# Patient Record
Sex: Male | Born: 1950
Health system: Southern US, Community
[De-identification: ages and names within clinical notes are randomized; demographics above are authoritative.]

## PROBLEM LIST (undated history)

## (undated) DIAGNOSIS — K635 Polyp of colon: Secondary | ICD-10-CM

## (undated) DIAGNOSIS — C4491 Basal cell carcinoma of skin, unspecified: Secondary | ICD-10-CM

## (undated) DIAGNOSIS — L309 Dermatitis, unspecified: Secondary | ICD-10-CM

## (undated) DIAGNOSIS — K219 Gastro-esophageal reflux disease without esophagitis: Secondary | ICD-10-CM

## (undated) DIAGNOSIS — K449 Diaphragmatic hernia without obstruction or gangrene: Secondary | ICD-10-CM

## (undated) HISTORY — DX: Gastro-esophageal reflux disease without esophagitis: K21.9

## (undated) HISTORY — DX: Basal cell carcinoma of skin, unspecified: C44.91

## (undated) HISTORY — DX: Diaphragmatic hernia without obstruction or gangrene: K44.9

## (undated) HISTORY — DX: Polyp of colon: K63.5

## (undated) HISTORY — PX: NASAL SEPTUM SURGERY: SHX37

## (undated) HISTORY — DX: Dermatitis, unspecified: L30.9

---

## 2001-08-20 ENCOUNTER — Other Ambulatory Visit: Admission: RE | Admit: 2001-08-20 | Discharge: 2001-08-20 | Payer: Self-pay | Admitting: Gastroenterology

## 2001-08-20 ENCOUNTER — Encounter (INDEPENDENT_AMBULATORY_CARE_PROVIDER_SITE_OTHER): Payer: Self-pay

## 2001-08-20 ENCOUNTER — Encounter: Payer: Self-pay | Admitting: Gastroenterology

## 2005-05-02 ENCOUNTER — Ambulatory Visit: Payer: Self-pay | Admitting: Gastroenterology

## 2005-05-14 ENCOUNTER — Ambulatory Visit: Payer: Self-pay | Admitting: Gastroenterology

## 2005-05-14 ENCOUNTER — Encounter (INDEPENDENT_AMBULATORY_CARE_PROVIDER_SITE_OTHER): Payer: Self-pay | Admitting: *Deleted

## 2005-05-25 ENCOUNTER — Encounter (INDEPENDENT_AMBULATORY_CARE_PROVIDER_SITE_OTHER): Payer: Self-pay | Admitting: *Deleted

## 2005-05-25 ENCOUNTER — Ambulatory Visit: Payer: Self-pay | Admitting: Gastroenterology

## 2005-06-13 ENCOUNTER — Ambulatory Visit: Payer: Self-pay | Admitting: Gastroenterology

## 2010-04-21 ENCOUNTER — Encounter (INDEPENDENT_AMBULATORY_CARE_PROVIDER_SITE_OTHER): Payer: Self-pay | Admitting: *Deleted

## 2010-05-02 ENCOUNTER — Encounter (INDEPENDENT_AMBULATORY_CARE_PROVIDER_SITE_OTHER): Payer: Self-pay | Admitting: *Deleted

## 2010-05-05 ENCOUNTER — Ambulatory Visit: Payer: Self-pay | Admitting: Gastroenterology

## 2010-06-13 ENCOUNTER — Ambulatory Visit: Payer: Self-pay | Admitting: Gastroenterology

## 2010-08-16 ENCOUNTER — Telehealth: Payer: Self-pay | Admitting: Gastroenterology

## 2010-08-21 ENCOUNTER — Ambulatory Visit: Payer: Self-pay | Admitting: Gastroenterology

## 2010-08-21 DIAGNOSIS — K219 Gastro-esophageal reflux disease without esophagitis: Secondary | ICD-10-CM | POA: Insufficient documentation

## 2010-08-21 DIAGNOSIS — Z860100 Personal history of colon polyps, unspecified: Secondary | ICD-10-CM | POA: Insufficient documentation

## 2010-08-30 ENCOUNTER — Ambulatory Visit: Payer: Self-pay | Admitting: Gastroenterology

## 2010-09-04 ENCOUNTER — Encounter: Payer: Self-pay | Admitting: Gastroenterology

## 2010-12-10 ENCOUNTER — Encounter: Payer: Self-pay | Admitting: Emergency Medicine

## 2010-12-21 NOTE — Letter (Signed)
Summary: Results Letter  [facility]  [address] [place], [place] [address]   Phone: [phone]  Fax: [phone]        September 04, 2010 MRN: [id number]    Zachary Burke [address] [place], [place]  [address]    Dear Zachary Burke,  Your stomach biopsies did not show any remarkable findings (benign gastric glands).  Please continue with the recommendations previously discussed.  Should you have any further questions or immediate concers, feel free to contact me.  Sincerely,  Barbette Hair. Arlyce Dice, M.D., [facility]          Sincerely,  Louis Meckel MD  This letter has been electronically signed by your physician.  Appended Document: Results Letter letter mailed

## 2010-12-21 NOTE — Miscellaneous (Signed)
Summary: [facility] Previsit/prep  Clinical Lists Changes  Medications: Added new medication of MOVIPREP 100 GM  SOLR (PEG-KCL-NACL-NASULF-NA ASC-C) As per prep instructions. - Signed Rx of MOVIPREP 100 GM  SOLR (PEG-KCL-NACL-NASULF-NA ASC-C) As per prep instructions.;  #1 x 0;  Signed;  Entered by: Wyona Almas RN;  Authorized by: Louis Meckel MD;  Method used: Electronically to [facility] #[id number]*, [address] [place], [place], [place]  [address], Ph: [phone], Fax: [phone] Observations: Added new observation of NKA: T (05/05/2010 8:58)    Prescriptions: MOVIPREP 100 GM  SOLR (PEG-KCL-NACL-NASULF-NA ASC-C) As per prep instructions.  #1 x 0   Entered by:   Wyona Almas RN   Authorized by:   Louis Meckel MD   Signed by:   Wyona Almas RN on 05/05/2010   Method used:   Electronically to        [facility]. [phone]* (retail)       [address]       [place], [place]  [address]       Ph: [phone]       Fax: [phone]   RxID:   [id number]

## 2010-12-21 NOTE — Procedures (Signed)
Summary: Colonoscopy  Patient: Rayon Mcchristian Note: All result statuses are Final unless otherwise noted.  Tests: (1) Colonoscopy (COL)   COL Colonoscopy           DONE (C)     [facility]     [address]     [place], [place]  [address]           COLONOSCOPY PROCEDURE REPORT           PATIENT:  Jovann, Luse  MR#:  [id number]     BIRTHDATE:  09-22-1951, 59 yrs. old  GENDER:  male           ENDOSCOPIST:  Barbette Hair. Arlyce Dice, MD     Referred by:           PROCEDURE DATE:  06/13/2010     PROCEDURE:  Diagnostic Colonoscopy     ASA CLASS:  Class I     INDICATIONS:  1) screening  2) history of pre-cancerous     (adenomatous) colon polyps Index polypectomy 2006           MEDICATIONS:   Fentanyl 75 mcg IV, Versed 7 mg IV           DESCRIPTION OF PROCEDURE:   After the risks benefits and     alternatives of the procedure were thoroughly explained, informed     consent was obtained.  Digital rectal exam was performed and     revealed no abnormalities.   The LB [id number] endoscope     was introduced through the anus and advanced to the cecum, which     was identified by the ileocecal valve, without limitations.  The     quality of the prep was excellent, using MoviPrep.  The instrument     was then slowly withdrawn as the colon was fully examined.     <<PROCEDUREIMAGES>>           FINDINGS:  Diverticula were found in the sigmoid colon (see     image18). Few diverticula  This was otherwise a normal examination     of the colon (see image1, image3, image4, image5, image6, image8,     image10, image12, image16, image17, image21, and image23).     Retroflexed views in the rectum revealed no abnormalities.    The     time to cecum =  5.50  minutes. The scope was then withdrawn (time     =  7.0  min) from the patient and the procedure completed.           COMPLICATIONS:  None           ENDOSCOPIC IMPRESSION:     1) Diverticula in the sigmoid colon     2) Otherwise  normal examination     RECOMMENDATIONS:     1) Colonoscopy 7 years           REPEAT EXAM:  In 7 year(s) for Colonoscopy.           ______________________________     Barbette Hair. Arlyce Dice, MD           CC: Lesle Chris, MD           n.     REVISED:  06/21/2010 08:12 AM     eSIGNED:   Barbette Hair. Kaplan at 06/21/2010 08:12 AM           [name], [id number]  Note: An exclamation mark (!) indicates a result that was not  dispersed into the flowsheet. Document Creation Date: 06/21/2010 8:13 AM _______________________________________________________________________  (1) Order result status: Final Collection or observation date-time: 06/13/2010 14:09 Requested date-time:  Receipt date-time:  Reported date-time:  Referring Physician:   Ordering Physician: Melvia Heaps [phone]) Specimen Source:  Source: Launa Grill Order Number: [id number] Lab site:

## 2010-12-21 NOTE — Procedures (Signed)
Summary: Colonoscopy   Colonoscopy  Procedure date:  08/20/2001  Findings:      Location:  [facility].    Colonoscopy  Procedure date:  08/20/2001  Findings:      Location:  [facility].    Patient Name: Zachary Burke, Zachary Burke MRN:  Procedure Procedures: Colonoscopy CPT: 84696.    with Hot Biopsy(s)CPT: Z451292.  Personnel: Endoscopist: Barbette Hair. Arlyce Dice, MD.  Referred By: Ellamae Sia, MD.  Exam Location: Exam performed in Outpatient Clinic. Outpatient  Patient Consent: Procedure, Alternatives, Risks and Benefits discussed, consent obtained, from patient.  Indications  Evaluation of: Anemia  History  Pre-Exam Physical: Performed Aug 20, 2001. Cardio-pulmonary exam, Rectal exam, HEENT exam , Abdominal exam, Extremity exam, Neurological exam, Mental status exam WNL.  Exam Exam: Extent of exam reached: Cecum, extent intended: Cecum.  ASA Classification: I. Tolerance: excellent.  Monitoring: Pulse and BP monitoring, Oximetry used. Supplemental O2 given.  Colon Prep Used Visicol for colon prep. Prep results: good.  Sedation Meds: Patient assessed and found to be appropriate for moderate (conscious) sedation. Fentanyl 100 mcg. Versed 7 mg.  Findings POLYP: Hepatic Flexure, Maximum size: 3 mm. Procedure:  hot biopsy, Polyp sent to pathology. ICD9: Nov 17, 1898.  POLYP: Sigmoid Colon, Maximum size: 2 mm. Distance from Anus 20 cm. Procedure:  hot biopsy, sent to pathology. ICD9: Nov 17, 1898.   Assessment Abnormal examination, see findings above.  Diagnoses: 211.3: Colon Polyps.   Events  Unplanned Interventions: No intervention was required.  Unplanned Events: There were no complications. Plans Patient Education: Patient given standard instructions for: Polyps.  Disposition: After procedure patient sent to recovery. After recovery patient sent home.  Scheduling/Referral: Colonoscopy, to Barbette Hair. Arlyce Dice, MD, around Aug 20, 2004.    CC: Molly Maduro Doolittle,MD  This report was created from the original endoscopy report, which was reviewed and signed by the above listed endoscopist.

## 2010-12-21 NOTE — Progress Notes (Signed)
Summary: Triage  Phone Note Call from Patient Call back at Work Phone [phone]   Caller: Patient Call For: Dr. Arlyce Dice Reason for Call: Talk to Nurse Summary of Call: pt. is asking speak directly to nurse Initial call taken by: Karna Christmas,  August 16, 2010 4:10 PM  Follow-up for Phone Call        Last Endo. was 05-14-2005. Pt. states his grandfather died of esophageal cancer, at his same age.  Pt. wants to know if he needs an Endoscopy or Ba Swallow  "Just to make sure everything is clear and clean."  Pt. states whenever his reflux flares-up he will use a Prilosec OTC, but doesn't take anything on a daily basis.   [facility] PLEASE REVIEW AND ADVISE  Follow-up by: Laureen Ochs LPN,  August 16, 2010 4:22 PM  Additional Follow-up for Phone Call Additional follow up Details #1::        This would be a very low yield in the absence of symptoms.  We don't regularly do surveillance endoscopy in this situation.   Additional Follow-up by: Louis Meckel MD,  August 16, 2010 5:33 PM    Additional Follow-up for Phone Call Additional follow up Details #2::    Above MD orders reviewed with patient. Pt. now c/o some dysphagia. Also changes in stool size and increased mucus/gas. Pt. requests an appt. to see Dr.Kaplan. Pt. is scheduled for 08-21-10 at 9:45am. Follow-up by: Laureen Ochs LPN,  August 17, 2010 10:33 AM

## 2010-12-21 NOTE — Letter (Signed)
Summary: EGD Instructions  [facility]  [address] [place], [place] [address]   Phone: [phone]  Fax: [phone]       HILLEL CARD    1951-06-27    MRN: [id number]       Procedure Day /Date:WEDNESDAY 08/30/2010     Arrival Time: 3PM     Procedure Time:4PM     Location of Procedure:                    X  [facility] (4th Floor)    PREPARATION FOR ENDOSCOPY   On10/10/2010 THE DAY OF THE PROCEDURE:  1.   No solid foods, milk or milk products are allowed after midnight the night before your procedure.  2.   Do not drink anything colored red or purple.  Avoid juices with pulp.  No orange juice.  3.  You may drink clear liquids until2PM, which is 2 hours before your procedure.                                                                                                CLEAR LIQUIDS INCLUDE: Water Jello Ice Popsicles Tea (sugar ok, no milk/cream) Powdered fruit flavored drinks Coffee (sugar ok, no milk/cream) Gatorade Juice: apple, white grape, white cranberry  Lemonade Clear bullion, consomm, broth Carbonated beverages (any kind) Strained chicken noodle soup Hard Candy   MEDICATION INSTRUCTIONS  Unless otherwise instructed, you should take regular prescription medications with a small sip of water as early as possible the morning of your procedure.              OTHER INSTRUCTIONS  You will need a responsible adult at least 59 years of age to accompany you and drive you home.   This person must remain in the waiting room during your procedure.  Wear loose fitting clothing that is easily removed.  Leave jewelry and other valuables at home.  However, you may wish to bring a book to read or an iPod/MP3 player to listen to music as you wait for your procedure to start.  Remove all body piercing jewelry and leave at home.  Total time from sign-in until discharge is approximately 2-3 hours.  You should go home directly  after your procedure and rest.  You can resume normal activities the day after your procedure.  The day of your procedure you should not:   Drive   Make legal decisions   Operate machinery   Drink alcohol   Return to work  You will receive specific instructions about eating, activities and medications before you leave.    The above instructions have been reviewed and explained to me by   _______________________    I fully understand and can verbalize these instructions _____________________________ Date _________

## 2010-12-21 NOTE — Assessment & Plan Note (Signed)
Summary: DYSPHAGIA, CHANGE IN STOOL SIZE,INCREASED MUCUS             D...   History of Present Illness Primary GI MD: Melvia Heaps MD [facility] Primary Provider: Elgie Congo, MD Chief Complaint: Change in bowels since February. Pt also wants to discuss having a EGD for family history of throat cancer. Also pt does have globus sensation after meals. Pt did stop Prilosec. History of Present Illness:   Mr. Carmean is a pleasant 60 year old white male referred at the request of Dr. Cleta Alberts for evaluation of reflux.  He has had intermittent pyrosis for years.  This is well controlled with Prilosec.  He does complain of a globus sensation.  Family history is pertinent for his grandfather who had esophageal cancer.  Patient is concerned because of his family history.  Mr Goodchild also complains about passing a small amount of mucus when he has a flatus.  Stools have slightly increased in volume and frequency.  Colonoscopy in July, 2011 demonstrated mild diverticulosis.  He has a history of an adenomatous polyp removed in 2006.   GI Review of Systems      Denies abdominal pain, acid reflux, belching, bloating, chest pain, dysphagia with liquids, dysphagia with solids, heartburn, loss of appetite, nausea, vomiting, vomiting blood, weight loss, and  weight gain.      Reports change in bowel habits and  diarrhea.     Denies anal fissure, black tarry stools, constipation, diverticulosis, fecal incontinence, heme positive stool, hemorrhoids, irritable bowel syndrome, jaundice, light color stool, liver problems, rectal bleeding, and  rectal pain. Preventive Screening-Counseling & Management  Alcohol-Tobacco     Smoking Status: never      Drug Use:  no.      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  Alcohol Use - no Daily Caffeine Use Illicit Drug Use - no Smoking Status:  never Drug Use:  no  Review of Systems  The patient denies allergy/sinus, anemia, anxiety-new, arthritis/joint pain, back pain, blood in urine, breast changes/lumps, change in vision, confusion, cough, coughing up blood, depression-new, fainting, fatigue, fever, headaches-new, hearing problems, heart murmur, heart rhythm changes, itching, menstrual pain, muscle pains/cramps, night sweats, nosebleeds, pregnancy symptoms, shortness of breath, skin rash, sleeping problems, sore throat, swelling of feet/legs, swollen lymph glands, thirst - excessive , urination - excessive , urination changes/pain, urine leakage, vision changes, and voice change.    Vital Signs:  Patient profile:   60 year old male Height:      72 inches Weight:      180.13 pounds BMI:     24.52 Pulse rate:   92 / minute Pulse rhythm:   regular BP sitting:   132 / 78  (right arm) Cuff size:   regular  Vitals Entered By: Christie Nottingham CMA Duncan Dull) (August 21, 2010 9:57 AM)  Physical Exam  Additional Exam:  On physical exam he is a well-developed male  skin: anicteric HEENT: normocephalic; PEERLA; no nasal or pharyngeal abnormalities neck: supple nodes: no cervical lymphadenopathy chest: clear to ausculatation and percussion heart: no murmurs, gallops, or rubs  abd: soft, nontender; BS normoactive; no abdominal masses, tenderness, organomegaly rectal: deferred ext: no cynanosis, clubbing, edema skeletal: no deformities neuro: oriented x 3; no focal abnormalities    Impression & Recommendations:  Problem # 1:  ESOPHAGEAL REFLUX  (ICD-530.81)  Plan endoscopy to rule out Barrett's esophagus.  He will continue omeprazole.  Orders: EGD (EGD)  Problem # 2:  PERSONAL HISTORY OF COLONIC POLYPS (ICD-V12.72) Plan  followup colonoscopy in 7 years.  The patient was also instructed to hold his Metamucil to determine if that affects the very minor change in bowel habits.  Patient Instructions: 1)  Copy sent to : Elgie Congo, MD 2)  Your EGD is scheduled for 08/30/2010 at 4pm 3)  The medication list was reviewed and reconciled.  All changed / newly prescribed medications were explained.  A complete medication list was provided to the patient / caregiver. Prescriptions: PRILOSEC 20 MG CPDR (OMEPRAZOLE) take one tab daily  #30 x 5   Entered and Authorized by:   Louis Meckel MD   Signed by:   Louis Meckel MD on 08/22/2010   Method used:   Electronically to        [facility] [phone]* (retail)       [address]       [place], [place]  [address]       Ph: [phone]       Fax: [phone]   RxID:   [id number]

## 2010-12-21 NOTE — Letter (Signed)
Summary: Previsit letter  [facility]  [address] [place], [place] [address]   Phone: [phone]  Fax: [phone]       04/21/2010 MRN: [id number]  Zachary Burke [address] [place], [place]  [address]  Dear Zachary Burke,  Welcome to the Gastroenterology Division at [facility].    You are scheduled to see a nurse for your pre-procedure visit on 06-09-10 at 8:30am on the 3rd floor at [facility], [address]  We ask that you try to arrive at our office 15 minutes prior to your appointment time to allow for check-in.  Your nurse visit will consist of discussing your medical and surgical history, your immediate family medical history, and your medications.    Please bring a complete list of all your medications or, if you prefer, bring the medication bottles and we will list them.  We will need to be aware of both prescribed and over the counter drugs.  We will need to know exact dosage information as well.  If you are on blood thinners (Coumadin, Plavix, Aggrenox, Ticlid, etc.) please call our office today/prior to your appointment, as we need to consult with your physician about holding your medication.   Please be prepared to read and sign documents such as consent forms, a financial agreement, and acknowledgement forms.  If necessary, and with your consent, a friend or relative is welcome to sit-in on the nurse visit with you.  Please bring your insurance card so that we may make a copy of it.  If your insurance requires a referral to see a specialist, please bring your referral form from your primary care physician.  No co-pay is required for this nurse visit.     If you cannot keep your appointment, please call [phone] to cancel or reschedule prior to your appointment date.  This allows [place] the opportunity to schedule an appointment for another patient in need of care.    Thank you for choosing  Gastroenterology for your medical  needs.  We appreciate the opportunity to care for you.  Please visit [place] at our website  to learn more about our practice.                     Sincerely.                                                                                                                   The Gastroenterology Division

## 2010-12-21 NOTE — Procedures (Signed)
Summary: Endoscopy   EGD  Procedure date:  05/14/2005  Findings:      Location: [facility]    EGD  Procedure date:  05/14/2005  Findings:      Location: [facility]     Patient Name: Zachary Burke, Zachary Burke MRN:  Procedure Procedures: Panendoscopy (EGD) CPT: 43235.    with biopsy(s)/brushing(s). CPT: D1846139.    with esophageal dilation. CPT: G9296129.  Personnel: Endoscopist: Barbette Hair. Arlyce Dice, MD.  Indications Symptoms: Dysphagia. Abdominal pain,  History  Current Medications: Patient is not currently taking Coumadin.  Pre-Exam Physical: Performed May 14, 2005  Cardio-pulmonary exam, HEENT exam, Abdominal exam, Mental status exam WNL.  Exam Exam Info: Maximum depth of insertion Duodenum, intended Duodenum. Vocal cords visualized. Gastric retroflexion performed. ASA Classification: I. Tolerance: excellent.  Sedation Meds: Robinul 0.2 given IV. Fentanyl 50 mcg. given IV. Versed 6 mg. given IV. Cetacaine Spray 2 sprays given aerosolized.  Monitoring: BP and pulse monitoring done. Oximetry used. Supplemental O2 given at 2 Liters.  Findings Normal: Distal Esophagus.  - Dilation: Distal Esophagus. for dysphagia without stricture. Maloney dilator used, Diameter: 17 mm, Minimal Resistance, No Heme present on extraction. Outcome: successful.  - MUCOSAL ABNORMALITY: Body to Antrum. Erythematous mucosa. Mottled mucosa. Biopsy/Mucosal Abn taken. ICD9: Gastritis, Unspecified: 535. 50.   Assessment Abnormal examination, see findings above.  Diagnoses: 535.50: Gastritis, Unspecified.   Events  Unplanned Intervention: No unplanned interventions were required.  Unplanned Events: There were no complications. Plans Medication(s): Await pathology. Continue current medications.  Disposition: After procedure patient sent to recovery. After recovery patient sent home.  Scheduling: Office Visit, to [employer]. Arlyce Dice, MD, around Jun 04, 2005.     CC: Maylon Peppers.Daub,MD  This report was created from the original endoscopy report, which was reviewed and signed by the above listed endoscopist.

## 2010-12-21 NOTE — Letter (Signed)
Summary: [facility] Instructions  [facility]  [address] [place], [place] [address]   Phone: [phone]  Fax: [phone]       Zachary Burke    1951/11/13    MRN: [id number]        Procedure Day Dorna Bloom:  Jake Shark  05/16/10     Arrival Time:  1:30pm     Procedure Time:  2:30pm     Location of Procedure:                    _X _   [facility]oscopy Center (4th Floor)                        PREPARATION FOR COLONOSCOPY WITH MOVIPREP   Starting 5 days prior to your procedure  THURSDAY 05/11/10 do not eat nuts, seeds, popcorn, corn, beans, peas,  salads, or any raw vegetables.  Do not take any fiber supplements (e.g. Metamucil, Citrucel, and Benefiber).  THE DAY BEFORE YOUR PROCEDURE         DATE:  MONDAY  05/15/10  1.  Drink clear liquids the entire day-NO SOLID FOOD  2.  Do not drink anything colored red or purple.  Avoid juices with pulp.  No orange juice.  3.  Drink at least 64 oz. (8 glasses) of fluid/clear liquids during the day to prevent dehydration and help the prep work efficiently.  CLEAR LIQUIDS INCLUDE: Water Jello Ice Popsicles Tea (sugar ok, no milk/cream) Powdered fruit flavored drinks Coffee (sugar ok, no milk/cream) Gatorade Juice: apple, white grape, white cranberry  Lemonade Clear bullion, consomm, broth Carbonated beverages (any kind) Strained chicken noodle soup Hard Candy                             4.  In the morning, mix first dose of MoviPrep solution:    Empty 1 Pouch A and 1 Pouch B into the disposable container    Add lukewarm drinking water to the top line of the container. Mix to dissolve    Refrigerate (mixed solution should be used within 24 hrs)  5.  Begin drinking the prep at 5:00 p.m. The MoviPrep container is divided by 4 marks.   Every 15 minutes drink the solution down to the next mark (approximately 8 oz) until the full liter is complete.   6.  Follow completed prep with 16 oz of clear liquid of your  choice (Nothing red or purple).  Continue to drink clear liquids until bedtime.  7.  Before going to bed, mix second dose of MoviPrep solution:    Empty 1 Pouch A and 1 Pouch B into the disposable container    Add lukewarm drinking water to the top line of the container. Mix to dissolve    Refrigerate  THE DAY OF YOUR PROCEDURE      DATE:  TUESDAY  05/16/10  Beginning at  9:30 a.m. (5 hours before procedure):         1. Every 15 minutes, drink the solution down to the next mark (approx 8 oz) until the full liter is complete.  2. Follow completed prep with 16 oz. of clear liquid of your choice.    3. You may drink clear liquids until 12:30pm  (2 HOURS BEFORE PROCEDURE).   MEDICATION INSTRUCTIONS  Unless otherwise instructed, you should take regular prescription medications with a small sip of water   as  early as possible the morning of your procedure.       OTHER INSTRUCTIONS  You will need a responsible adult at least 59 years of age to accompany you and drive you home.   This person must remain in the waiting room during your procedure.  Wear loose fitting clothing that is easily removed.  Leave jewelry and other valuables at home.  However, you may wish to bring a book to read or  an iPod/MP3 player to listen to music as you wait for your procedure to start.  Remove all body piercing jewelry and leave at home.  Total time from sign-in until discharge is approximately 2-3 hours.  You should go home directly after your procedure and rest.  You can resume normal activities the  day after your procedure.  The day of your procedure you should not:   Drive   Make legal decisions   Operate machinery   Drink alcohol   Return to work  You will receive specific instructions about eating, activities and medications before you leave.    The above instructions have been reviewed and explained to me by   Wyona Almas RN  May 05, 2010 9:26 AM     I fully  understand and can verbalize these instructions _____________________________ Date _________

## 2010-12-21 NOTE — Procedures (Signed)
Summary: Colonoscopy   Colonoscopy  Procedure date:  05/25/2005  Findings:      Location:  [facility].     Patient Name: Zachary Burke, Schamp MRN:  Procedure Procedures: Colonoscopy CPT: [address].    with polypectomy. CPT: A3573898.  Personnel: Endoscopist: Barbette Hair. Arlyce Dice, MD.  Patient Consent: Procedure, Alternatives, Risks and Benefits discussed, consent obtained, from patient.  Indications  Surveillance of: Adenomatous Polyp(s). Initial polypectomy was performed in 2002.  History  Current Medications: Patient is not currently taking Coumadin.  Pre-Exam Physical: Performed May 25, 2005. Cardio-pulmonary exam, HEENT exam , Abdominal exam, Mental status exam WNL.  Exam Exam: Extent of exam reached: Cecum, extent intended: Cecum.  The cecum was identified by IC valve. Colon retroflexion performed. ASA Classification: I. Tolerance: good.  Monitoring: Pulse and BP monitoring, Oximetry used. Supplemental O2 given. at 2 Liters.  Colon Prep Used Miralax for colon prep. Prep results: good.  Sedation Meds: Patient assessed and found to be appropriate for moderate (conscious) sedation. Sedation was managed by the Endoscopist. Fentanyl 100 mcg. given IV. Versed 10 mg. given IV. Promethazine 12.5 mg given IV.  Findings POLYP: Hepatic Flexure, Maximum size: 10 mm. sessile polyp. Procedure:  snare with cautery, Polyp sent to pathology. ICD9: Colon Polyps: 211.3.  NORMAL EXAM: Cecum.  NORMAL EXAM: Rectum.   Assessment Abnormal examination, see findings above.  Diagnoses: 211.3: Colon Polyps.   Events  Unplanned Interventions: No intervention was required.  Unplanned Events: There were no complications. Plans  Post Exam Instructions: Post sedation instructions given.  Patient Education: Patient given standard instructions for: Polyps.  Disposition: After procedure patient sent to recovery. After recovery patient sent home.   Scheduling/Referral: Colonoscopy, to Barbette Hair. Arlyce Dice, MD, around May 25, 2010.    CC: [email or web address]  This report was created from the original endoscopy report, which was reviewed and signed by the above listed endoscopist.

## 2010-12-21 NOTE — Procedures (Signed)
Summary: Colonoscopy  Patient: [name] Note: All result statuses are Final unless otherwise noted.  Tests: (1) Colonoscopy (COL)   COL Colonoscopy           DONE     [facility]     [address]     [place], [place]  [address]           COLONOSCOPY PROCEDURE REPORT           PATIENT:  Zachary Burke, Zachary Burke  MR#:  [id number]     BIRTHDATE:  1951-11-16, 59 yrs. old  GENDER:  male           ENDOSCOPIST:  Barbette Hair. Arlyce Dice, MD     Referred by:           PROCEDURE DATE:  06/13/2010     PROCEDURE:  Diagnostic Colonoscopy     ASA CLASS:  Class I     INDICATIONS:  1) screening  2) history of pre-cancerous     (adenomatous) colon polyps Index polypectomy 2006           MEDICATIONS:   Fentanyl 50 mcg IV, Versed 7 mg IV           DESCRIPTION OF PROCEDURE:   After the risks benefits and     alternatives of the procedure were thoroughly explained, informed     consent was obtained.  Digital rectal exam was performed and     revealed no abnormalities.   The LB CF-H180AL [id number] endoscope     was introduced through the anus and advanced to the cecum, which     was identified by the ileocecal valve, without limitations.  The     quality of the prep was excellent, using MoviPrep.  The instrument     was then slowly withdrawn as the colon was fully examined.     <<PROCEDUREIMAGES>>           FINDINGS:  Diverticula were found in the sigmoid colon (see     image18). Few diverticula  This was otherwise a normal examination     of the colon (see image1, image3, image4, image5, image6, image8,     image10, image12, image16, image17, image21, and image23).     Retroflexed views in the rectum revealed no abnormalities.    The     time to cecum =  5.50  minutes. The scope was then withdrawn (time     =  7.0  min) from the patient and the procedure completed.           COMPLICATIONS:  None           ENDOSCOPIC IMPRESSION:     1) Diverticula in the sigmoid colon     2) Otherwise  normal examination     RECOMMENDATIONS:     1) Colonoscopy 7 years           REPEAT EXAM:  In 7 year(s) for Colonoscopy.           ______________________________     Barbette Hair. Arlyce Dice, MD           CC: Lesle Chris, MD           n.     Rosalie Doctor:   Barbette Hair. Jaysun Wessels at 06/13/2010 02:17 PM           [name], [id number]  Note: An exclamation mark (!) indicates a result that was not dispersed into the flowsheet. Document Creation Date: 06/13/2010 2:19 PM  _______________________________________________________________________  (1) Order result status: Final Collection or observation date-time: 06/13/2010 14:09 Requested date-time:  Receipt date-time:  Reported date-time:  Referring Physician:   Ordering Physician: Melvia Heaps [phone]) Specimen Source:  Source: Launa Grill Order Number: [id number] Lab site:   Appended Document: Colonoscopy    Clinical Lists Changes  Observations: Added new observation of COLONNXTDUE: 05/2017 (06/13/2010 14:57)

## 2010-12-21 NOTE — Procedures (Signed)
Summary: Upper Endoscopy  Patient: Natalio Salois Note: All result statuses are Final unless otherwise noted.  Tests: (1) Upper Endoscopy (EGD)   EGD Upper Endoscopy       DONE     [facility]     [address]     [place], [place]  [address]           ENDOSCOPY PROCEDURE REPORT           PATIENT:  Suhaas, Agena  MR#:  [id number]     BIRTHDATE:  1950/12/31, 59 yrs. old  GENDER:  male           ENDOSCOPIST:  Barbette Hair. Arlyce Dice, MD     Referred by:           PROCEDURE DATE:  08/30/2010     PROCEDURE:  EGD with biopsy     ASA CLASS:  Class I     INDICATIONS:  heartburn           MEDICATIONS:   Fentanyl 50 mcg IV, Versed 6 mg IV, glycopyrrolate     (Robinal) 0.2 mg IV, 0.6cc simethancone 0.6 cc PO     TOPICAL ANESTHETIC:  Exactacain Spray           DESCRIPTION OF PROCEDURE:   After the risks benefits and     alternatives of the procedure were thoroughly explained, informed     consent was obtained.  The LB GIF-H180 [id number] endoscope was     introduced through the mouth and advanced to the third portion of     the duodenum, without limitations.  The instrument was slowly     withdrawn as the mucosa was fully examined.     <<PROCEDUREIMAGES>>           Erythema was found in the body of the stomach (see image2).     Minimal erythema  There were multiple polyps identified. in the     fundus. At least 4 2mm sessile polyps. Bxs taken (see image5 and     image6).  Otherwise the examination was normal (see image8,     image9, image11, image14, and image1).    Retroflexed views     revealed no abnormalities.    The scope was then withdrawn from     the patient and the procedure completed.           COMPLICATIONS:  None           ENDOSCOPIC IMPRESSION:     1) Erythema in the body of the stomach     2) Polyps, multiple in the fundus     3) Otherwise normal examination     RECOMMENDATIONS:     1) continue PPI           REPEAT EXAM:  In You will receive a letter from  Dr. Arlyce Dice in 1-2     weeks, after reviewing the final pathology, with followup     recommendations. for.           ______________________________     Barbette Hair. Arlyce Dice, MD           CC:  Lesle Chris, MD           n.     Rosalie Doctor:   Barbette Hair. Kaplan at 08/30/2010 04:24 PM           [name], [id number]  Note: An exclamation mark (!) indicates a result that was not dispersed into the flowsheet. Document  Creation Date: 08/30/2010 4:24 PM _______________________________________________________________________  (1) Order result status: Final Collection or observation date-time: 08/30/2010 16:10 Requested date-time:  Receipt date-time:  Reported date-time:  Referring Physician:   Ordering Physician: Melvia Heaps [phone]) Specimen Source:  Source: Launa Grill Order Number: [id number] Lab site:

## 2012-02-11 ENCOUNTER — Ambulatory Visit (INDEPENDENT_AMBULATORY_CARE_PROVIDER_SITE_OTHER): Payer: BC Managed Care – PPO | Admitting: Gastroenterology

## 2012-02-11 ENCOUNTER — Encounter: Payer: Self-pay | Admitting: Gastroenterology

## 2012-02-11 VITALS — BP 124/82 | HR 76 | Ht 72.0 in | Wt 188.1 lb

## 2012-02-11 DIAGNOSIS — K219 Gastro-esophageal reflux disease without esophagitis: Secondary | ICD-10-CM

## 2012-02-11 DIAGNOSIS — Z86010 Personal history of colonic polyps: Secondary | ICD-10-CM

## 2012-02-11 MED ORDER — OMEPRAZOLE 20 MG PO CPDR: 20.0000 mg | DELAYED_RELEASE_CAPSULE | Freq: Every day | ORAL | Status: DC

## 2012-02-11 NOTE — Progress Notes (Signed)
History of Present Illness:  Zachary Burke has returned for followup of acid reflux. On occasions he has pyrosis. He is on no medications. Endoscopy in October, 2011 demonstrated fundic appearing polyps which were confirmed by pathology.    Review of Systems: Pertinent positive and negative review of systems were noted in the above HPI section. All other review of systems were otherwise negative.    Current Medications, Allergies, Past Medical History, Past Surgical History, Family History and Social History were reviewed in [employer] electronic medical record  Vital signs were reviewed in today's medical record. Physical Exam: General: Well developed , well nourished, no acute distress Head: Normocephalic and atraumatic Eyes:  sclerae anicteric, EOMI Ears: Normal auditory acuity Mouth: No deformity or lesions Lungs: Clear throughout to auscultation Heart: Regular rate and rhythm; no murmurs, rubs or bruits Abdomen: Soft, non tender and non distended. No masses, hepatosplenomegaly or hernias noted. Normal Bowel sounds Rectal:deferred Musculoskeletal: Symmetrical with no gross deformities  Pulses:  Normal pulses noted Extremities: No clubbing, cyanosis, edema or deformities noted Neurological: Alert oriented x 4, grossly nonfocal Psychological:  Alert and cooperative. Normal mood and affect

## 2012-02-11 NOTE — Patient Instructions (Signed)
Follow up as needed

## 2012-02-11 NOTE — Assessment & Plan Note (Addendum)
Symptoms are well controlled with diet with occasional breakthrough pyrosis  Medications #1 antacids  or omeprazole when necessary

## 2012-02-11 NOTE — Assessment & Plan Note (Signed)
Plan followup colonoscopy 2018 

## 2012-06-01 ENCOUNTER — Ambulatory Visit (INDEPENDENT_AMBULATORY_CARE_PROVIDER_SITE_OTHER): Payer: BC Managed Care – PPO | Admitting: Emergency Medicine

## 2012-06-01 VITALS — BP 127/75 | HR 55 | Temp 98.1°F | Resp 16 | Ht 72.0 in | Wt 183.0 lb

## 2012-06-01 DIAGNOSIS — R21 Rash and other nonspecific skin eruption: Secondary | ICD-10-CM

## 2012-06-01 MED ORDER — DOXYCYCLINE HYCLATE 100 MG PO TABS: 100.0000 mg | ORAL_TABLET | Freq: Two times a day (BID) | ORAL | Status: DC

## 2012-06-01 NOTE — Progress Notes (Signed)
  Subjective:    Patient ID: Zachary Burke, male    DOB: 10-Feb-1951, 61 y.o.   MRN: [id number]  HPI patient enters with pain on the right side of his neck and shoulder. He has also noticed a rash developed on the right upper abdomen. His rash is not pruritic. He has not had any fever or chills the    Review of Systems     Objective:   Physical Exam there is a 6 x 6 cm rash over the right upper abdomen. There is central clearing of this rash.        Assessment & Plan:  We'll treat with doxy for 14 days .

## 2012-06-01 NOTE — Patient Instructions (Addendum)
Lyme Disease  You may have been bitten by a tick and are to watch for the development of Lyme Disease. Lyme Disease is an infection that is caused by a bacteria The bacteria causing this disease is named Borreilia burgdorferi. If a tick is infected with this bacteria and then bites you, then Lyme Disease may occur. These ticks are carried by deer and rodents such as rabbits and mice and infest grassy as well as forested areas. Fortunately most tick bites do not cause Lyme Disease.   Lyme Disease is easier to prevent than to treat. First, covering your legs with clothing when walking in areas where ticks are possibly abundant will prevent their attachment because ticks tend to stay within inches of the ground. Second, using insecticides containing DEET can be applied on skin or clothing. Last, because it takes about 12 to 24 hours for the tick to transmit the disease after attachment to the human host, you should inspect your body for ticks twice a day when you are in areas where Lyme Disease is common. You must look thoroughly when searching for ticks. The Ixodes tick that carries Lyme Disease is very small. It is around the size of a sesame seed (picture of tick is not actual size). Removal is best done by grasping the tick by the head and pulling it out. Do not to squeeze the body of the tick. This could inject the infecting bacteria into the bite site. Wash the area of the bite with an antiseptic solution after removal.   Lyme Disease is a disease that may affect many body systems. Because of the small size of the biting tick, most people do not notice being bitten. The first sign of an infection is usually a round red rash that extends out from the center of the tick bite. The center of the lesion may be blood colored (hemorrhagic) or have tiny blisters (vesicular). Most lesions have bright red outer borders and partial central clearing. This rash may extend out many inches in diameter, and multiple lesions may  be present. Other symptoms such as fatigue, headaches, chills and fever, general achiness and swelling of lymph glands may also occur. If this first stage of the disease is left untreated, these symptoms may gradually resolve by themselves, or progressive symptoms may occur because of spread of infection to other areas of the body.   Follow up with your caregiver to have testing and treatment if you have a tick bite and you develop any of the above complaints. Your caregiver may recommend preventative (prophylactic) medications which kill bacteria (antibiotics). Once a diagnosis of Lyme Disease is made, antibiotic treatment is highly likely to cure the disease. Effective treatment of late stage Lyme Disease may require longer courses of antibiotic therapy.   MAKE SURE YOU:    Understand these instructions.   Will watch your condition.   Will get help right away if you are not doing well or get worse.  Document Released: 02/11/2001 Document Revised: 10/25/2011 Document Reviewed: 04/15/2009  [employer] Patient Information 2012 [employer].

## 2012-09-17 ENCOUNTER — Other Ambulatory Visit: Payer: Self-pay | Admitting: Dermatology

## 2013-01-21 ENCOUNTER — Other Ambulatory Visit: Payer: Self-pay | Admitting: Dermatology

## 2013-06-23 ENCOUNTER — Other Ambulatory Visit: Payer: Self-pay | Admitting: Dermatology

## 2013-11-05 ENCOUNTER — Other Ambulatory Visit: Payer: Self-pay | Admitting: Dermatology

## 2014-12-06 ENCOUNTER — Ambulatory Visit (INDEPENDENT_AMBULATORY_CARE_PROVIDER_SITE_OTHER): Payer: BLUE CROSS/BLUE SHIELD | Admitting: Family Medicine

## 2014-12-06 ENCOUNTER — Encounter: Payer: Self-pay | Admitting: Family Medicine

## 2014-12-06 ENCOUNTER — Ambulatory Visit (INDEPENDENT_AMBULATORY_CARE_PROVIDER_SITE_OTHER): Payer: BLUE CROSS/BLUE SHIELD

## 2014-12-06 VITALS — BP 127/82 | HR 70 | Temp 97.8°F | Resp 16 | Ht 71.5 in | Wt 183.0 lb

## 2014-12-06 DIAGNOSIS — R002 Palpitations: Secondary | ICD-10-CM

## 2014-12-06 DIAGNOSIS — R0602 Shortness of breath: Secondary | ICD-10-CM

## 2014-12-06 DIAGNOSIS — Z23 Encounter for immunization: Secondary | ICD-10-CM

## 2014-12-06 DIAGNOSIS — K219 Gastro-esophageal reflux disease without esophagitis: Secondary | ICD-10-CM

## 2014-12-06 DIAGNOSIS — Z0000 Encounter for general adult medical examination without abnormal findings: Secondary | ICD-10-CM

## 2014-12-06 DIAGNOSIS — Z125 Encounter for screening for malignant neoplasm of prostate: Secondary | ICD-10-CM

## 2014-12-06 LAB — LIPID PANEL
CHOL/HDL RATIO: 3.7 ratio
Cholesterol: 153 mg/dL (ref 0–200)
HDL: 41 mg/dL (ref >39)
LDL CALC: 89 mg/dL (ref 0–99)
Triglycerides: 113 mg/dL (ref <150)
VLDL: 23 mg/dL (ref 0–40)

## 2014-12-06 LAB — COMPREHENSIVE METABOLIC PANEL
ALBUMIN: 4.2 g/dL (ref 3.5–5.2)
ALK PHOS: 53 U/L (ref 39–117)
ALT: 12 U/L (ref 0–53)
AST: 18 U/L (ref 0–37)
BILIRUBIN TOTAL: 0.7 mg/dL (ref 0.2–1.2)
BUN: 17 mg/dL (ref 6–23)
CALCIUM: 9.4 mg/dL (ref 8.4–10.5)
CHLORIDE: 103 meq/L (ref 96–112)
CO2: 25 meq/L (ref 19–32)
CREATININE: 0.91 mg/dL (ref 0.50–1.35)
GLUCOSE: 85 mg/dL (ref 70–99)
Potassium: 4.7 mEq/L (ref 3.5–5.3)
SODIUM: 138 meq/L (ref 135–145)
TOTAL PROTEIN: 7.1 g/dL (ref 6.0–8.3)

## 2014-12-06 LAB — CBC
HCT: 44.5 % (ref 39.0–52.0)
Hemoglobin: 15.2 g/dL (ref 13.0–17.0)
MCH: 30 pg (ref 26.0–34.0)
MCHC: 34.2 g/dL (ref 30.0–36.0)
MCV: 87.8 fL (ref 78.0–100.0)
MPV: 10.3 fL (ref 8.6–12.4)
Platelets: 204 10*3/uL (ref 150–400)
RBC: 5.07 MIL/uL (ref 4.22–5.81)
RDW: 13.9 % (ref 11.5–15.5)
WBC: 4.8 10*3/uL (ref 4.0–10.5)

## 2014-12-06 LAB — TSH: TSH: 1.967 u[IU]/mL (ref 0.350–4.500)

## 2014-12-06 NOTE — Progress Notes (Signed)
Subjective:    Patient ID: Zachary Burke, male    DOB: 1951-07-16, 64 y.o.   MRN: [id number]  HPI This is a very pleasant 64 yo male who presents for CPE. 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   Last tetanus- 2006 Flu- declines Colonscopy- 7/11- Dr. Deatra Ina Dental- regular Eye- regular Exercise- swims several times a week, hikes frequently  The patient reports his health is good other than occasional anxiety. He has learned to manage his anxiety through the years with deep breathing techniques. The patient has had a rough month. His father passed away 06-Nov-2014. He and his wife had taken care of his father for the last 25 years and the patient has feelings of being lost with the change of his routines.  The patient reports having several episodes of "pre-panic attack" feelings and heart racing in the last month. Two episodes occurred at the end of his swim and he had two episode when he was having intercourse with his wife where he was unable to ejaculate due to similar feelings. The episodes are very brief and subside in less than a minute. He does not have chest pain or diaphoresis. He has noticed that when he thinks about his breathing, he has to take a deep breath. He has not resumed swimming or had intercourse since he had these sensations. He thinks they are likely anxiety related, but wants to be checked out.   His father passed away in his sleep and was in his 74s, his mother died in her 70's from COPD. The patient reports that his father had a chest Xray shortly before his death and was told he had COPD. The patient thinks this diagnosis made his father "give up" and decide to die because he didn't want to suffer the was his wife had prior to her death. The patient is very concerned about his heavy second hand smoke exposure as a child. He has never smoked, nor has he had worrisome occupational exposure.    Review of Systems  Constitutional: Negative.     HENT: Negative.   Eyes: Negative.   Respiratory: Positive for shortness of breath (sometimes feels as though he must take a deep breath.). Negative for chest tightness.   Cardiovascular: Positive for palpitations. Negative for chest pain and leg swelling.  Gastrointestinal: Negative for nausea, vomiting, abdominal pain, diarrhea and constipation.  Endocrine: Negative.   Genitourinary: Negative.   Musculoskeletal: Negative.   Skin: Negative.   Allergic/Immunologic: Negative.   Neurological: Negative for dizziness, weakness, light-headedness and headaches.  Hematological: Negative.   Psychiatric/Behavioral: Negative for sleep disturbance and dysphoric mood. The patient is nervous/anxious.       Objective:   Physical Exam  Constitutional: He is oriented to person, place, and time. He appears well-developed and well-nourished. No distress.  HENT:  Head: Normocephalic and atraumatic.  Right Ear: Tympanic membrane, external ear and ear canal normal.  Left Ear: Tympanic membrane, external ear and ear canal normal.  Nose: Nose normal.  Mouth/Throat: Oropharynx is clear and moist.  Eyes: Conjunctivae are normal. Pupils are equal, round, and reactive to light.  Neck: Normal range of motion. Neck supple. No JVD present.  Cardiovascular: Normal rate, regular rhythm, normal heart sounds and intact distal pulses.   Pulmonary/Chest: Effort normal and breath sounds normal.  Abdominal: Soft. Bowel sounds are normal. He exhibits no distension and no mass. There is no tenderness. There is no rebound and no guarding. Hernia confirmed negative  in the right inguinal area and confirmed negative in the left inguinal area.  Genitourinary: Rectum normal, prostate normal, testes normal and penis normal. Circumcised. No penile erythema or penile tenderness. No discharge found.  Musculoskeletal: Normal range of motion. He exhibits no edema or tenderness.  Lymphadenopathy:    He has no cervical adenopathy.        Right: No inguinal adenopathy present.       Left: No inguinal adenopathy present.  Neurological: He is alert and oriented to person, place, and time. He has normal reflexes.  Skin: Skin is warm and dry. He is not diaphoretic.     Psychiatric: He has a normal mood and affect. His behavior is normal. Judgment and thought content normal.  Vitals reviewed.  BP 127/82 mmHg  Pulse 70  Temp(Src) 97.8 F (36.6 C) (Oral)  Resp 16  Ht 5' 11.5" (1.816 m)  Wt 183 lb (83.008 kg)  BMI 25.17 kg/m2  SpO2 100% CXRAY- [facility] reading (PRIMARY) by  Dr. Tamala Julian- no abnormalities seen EKG- sinus brady; reviewed with Dr. Tamala Julian    Assessment & Plan:  Discussed with Dr. Tamala Julian 1. Palpitations - CBC - Comprehensive metabolic panel - Lipid panel - TSH - EKG 12-Lead - DG Chest 2 View; Future - Discussed cardiology referral with patient who declined at this time, stating that he wants to see results of labs and is reassured by normal EKG/CXR  2. Gastroesophageal reflux disease, esophagitis presence not specified - continued follow up with GI- Dr. Deatra Ina  3. Screening for prostate cancer - PSA  4. Routine general medical examination at a health care facility   5. SOB (shortness of breath) - DG Chest 2 View; Future  6. Need for prophylactic vaccination with combined diphtheria-tetanus-pertussis (DTP) vaccine - Tdap vaccine greater than or equal to 7yo IM   Elby Beck, FNP-BC  [facility]  12/08/2014 11:26 AM

## 2014-12-07 LAB — PSA: PSA: 0.77 ng/mL (ref <=4.00)

## 2015-01-12 ENCOUNTER — Other Ambulatory Visit: Payer: Self-pay | Admitting: Dermatology

## 2015-08-23 ENCOUNTER — Encounter: Payer: Self-pay | Admitting: Emergency Medicine

## 2016-03-28 ENCOUNTER — Encounter: Payer: Self-pay | Admitting: Gastroenterology

## 2016-05-08 ENCOUNTER — Encounter: Payer: Self-pay | Admitting: Family Medicine

## 2016-05-08 ENCOUNTER — Ambulatory Visit (INDEPENDENT_AMBULATORY_CARE_PROVIDER_SITE_OTHER): Payer: BLUE CROSS/BLUE SHIELD | Admitting: Family Medicine

## 2016-05-08 VITALS — BP 115/73 | HR 55 | Temp 97.9°F | Resp 16 | Ht 72.0 in | Wt 181.6 lb

## 2016-05-08 DIAGNOSIS — Z1389 Encounter for screening for other disorder: Secondary | ICD-10-CM

## 2016-05-08 DIAGNOSIS — Z130 Encounter for screening for diseases of the blood and blood-forming organs and certain disorders involving the immune mechanism: Secondary | ICD-10-CM | POA: Diagnosis not present

## 2016-05-08 DIAGNOSIS — Z0000 Encounter for general adult medical examination without abnormal findings: Secondary | ICD-10-CM | POA: Diagnosis not present

## 2016-05-08 DIAGNOSIS — Z13220 Encounter for screening for lipoid disorders: Secondary | ICD-10-CM

## 2016-05-08 LAB — COMPLETE METABOLIC PANEL WITH GFR
ALBUMIN: 4.3 g/dL (ref 3.6–5.1)
ALT: 12 U/L (ref 9–46)
AST: 17 U/L (ref 10–35)
Alkaline Phosphatase: 58 U/L (ref 40–115)
BUN: 18 mg/dL (ref 7–25)
CALCIUM: 9.2 mg/dL (ref 8.6–10.3)
CHLORIDE: 103 mmol/L (ref 98–110)
CO2: 27 mmol/L (ref 20–31)
Creat: 0.99 mg/dL (ref 0.70–1.25)
GFR, Est African American: >89 mL/min (ref >=60)
GFR, Est Non African American: 80 mL/min (ref >=60)
GLUCOSE: 93 mg/dL (ref 65–99)
Potassium: 4.4 mmol/L (ref 3.5–5.3)
Sodium: 137 mmol/L (ref 135–146)
TOTAL PROTEIN: 7 g/dL (ref 6.1–8.1)
Total Bilirubin: 0.5 mg/dL (ref 0.2–1.2)

## 2016-05-08 LAB — LIPID PANEL
CHOL/HDL RATIO: 3.1 ratio (ref <=5.0)
Cholesterol: 147 mg/dL (ref 125–200)
HDL: 47 mg/dL (ref >=40)
LDL CALC: 81 mg/dL (ref <130)
Triglycerides: 93 mg/dL (ref <150)
VLDL: 19 mg/dL (ref <30)

## 2016-05-08 LAB — CBC
HEMATOCRIT: 43.4 % (ref 38.5–50.0)
Hemoglobin: 14.5 g/dL (ref 13.2–17.1)
MCH: 29.7 pg (ref 27.0–33.0)
MCHC: 33.4 g/dL (ref 32.0–36.0)
MCV: 88.8 fL (ref 80.0–100.0)
MPV: 10.7 fL (ref 7.5–12.5)
Platelets: 208 10*3/uL (ref 140–400)
RBC: 4.89 MIL/uL (ref 4.20–5.80)
RDW: 14 % (ref 11.0–15.0)
WBC: 4.4 10*3/uL (ref 3.8–10.8)

## 2016-05-08 NOTE — Progress Notes (Signed)
Subjective:    Patient ID: Ladre Sines, male    DOB: 05-Oct-1951, 65 y.o.   MRN: [id number]  HPI This is a pleasant 65 yo male who presents today for CPE. He has been doing well. He and his wife will be celebrating their 19 yo anniversary and going to [place]. 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   Last CPE- 1/16 PSA- 1/16 Colonoscopy- 2011 Tdap- 1/16 Flu- never Dental- regular Eye- regular Exercise- swims, walks  Past Medical History  Diagnosis Date  . Colon polyps     2002,2006  . GERD (gastroesophageal reflux disease)   . Hiatal hernia   . Basal cell carcinoma    Past Surgical History  Procedure Laterality Date  . Nasal septum surgery     Family History  Problem Relation Age of Onset  . Prostate cancer Paternal Uncle   . Esophageal cancer Paternal Grandfather   . Colon polyps Mother   . Colon polyps Father   . Colon cancer Paternal Aunt    Social History  Substance Use Topics  . Smoking status: Never Smoker   . Smokeless tobacco: Never Used  . Alcohol Use: No     Review of Systems  Constitutional: Negative.   HENT: Negative.   Eyes: Negative.   Respiratory: Negative.   Cardiovascular: Negative.   Gastrointestinal: Negative.   Endocrine: Negative.   Genitourinary: Negative.   Musculoskeletal: Negative.   Skin: Negative.   Allergic/Immunologic: Negative.   Neurological: Negative.   Hematological: Negative.   Psychiatric/Behavioral: Negative.        Objective:   Physical Exam  Constitutional: He is oriented to person, place, and time. He appears well-developed and well-nourished. No distress.  HENT:  Head: Normocephalic and atraumatic.  Right Ear: External ear normal.  Left Ear: External ear normal.  Nose: Nose normal.  Mouth/Throat: Oropharynx is clear and moist.  Eyes: Conjunctivae are normal. Pupils are equal, round, and reactive to light. Right eye exhibits no discharge. Left eye exhibits no  discharge.  Neck: Normal range of motion. Neck supple. No JVD present. No thyromegaly present.  Cardiovascular: Normal rate, regular rhythm and normal heart sounds.   Pulmonary/Chest: Effort normal and breath sounds normal.  Abdominal: Soft. Bowel sounds are normal. He exhibits no distension and no mass. There is no tenderness. There is no rebound and no guarding. Hernia confirmed negative in the right inguinal area and confirmed negative in the left inguinal area.  Small, reducible umbilical hernia.   Genitourinary: Testes normal and penis normal. Circumcised. No phimosis, paraphimosis, hypospadias, penile erythema or penile tenderness. No discharge found.  Musculoskeletal: Normal range of motion. He exhibits no edema or tenderness.  Lymphadenopathy:    He has no cervical adenopathy.  Neurological: He is alert and oriented to person, place, and time. He has normal reflexes.  Skin: Skin is warm and dry. He is not diaphoretic.  Psychiatric: He has a normal mood and affect. His behavior is normal. Judgment and thought content normal.  Vitals reviewed.     BP 115/73 mmHg  Pulse 55  Temp(Src) 97.9 F (36.6 C) (Oral)  Resp 16  Ht 6' (1.829 m)  Wt 181 lb 9.6 oz (82.373 kg)  BMI 24.62 kg/m2 Wt Readings from Last 3 Encounters:  05/08/16 181 lb 9.6 oz (82.373 kg)  12/06/14 183 lb (83.008 kg)  06/01/12 183 lb (83.008 kg)   Depression screen [facility] 2/9 05/08/2016 12/06/2014  Decreased Interest 0 0  Down,  Depressed, Hopeless 0 0  PHQ - 2 Score 0 0        Assessment & Plan:  1. Annual physical exam - - Discussed and encouraged healthy lifestyle choices- adequate sleep, regular exercise, stress management and healthy food choices.   2. Screening for lipid disorders - Lipid panel  3. Screening for deficiency anemia - CBC  4. Screening for nephropathy - COMPLETE METABOLIC PANEL WITH GFR   Clarene Reamer, FNP-BC  [facility]  05/08/2016  11:12 AM

## 2016-05-08 NOTE — Patient Instructions (Addendum)
Keeping you healthy  Get these tests  Blood pressure- Have your blood pressure checked once a year by your healthcare provider.  Normal blood pressure is 120/80  Weight- Have your body mass index (BMI) calculated to screen for obesity.  BMI is a measure of body fat based on height and weight. You can also calculate your own BMI at www.nhl[facility].com/bmi/.  Cholesterol- Have your cholesterol checked every year.  Diabetes- Have your blood sugar checked regularly if you have high blood pressure, high cholesterol, have a family history of diabetes or if you are overweight.  Screening for Colon Cancer- Colonoscopy starting at age 50.  Screening may begin sooner depending on your family history and other health conditions. Follow up colonoscopy as directed by your Gastroenterologist.  Screening for Prostate Cancer- Both blood work (PSA) and a rectal exam help screen for Prostate Cancer.  Screening begins at age 40 with African-American men and at age 50 with Caucasian men.  Screening may begin sooner depending on your family history.  Take these medicines  Aspirin- One aspirin daily can help prevent Heart disease and Stroke.  Flu shot- Every fall.  Tetanus- Every 10 years.  Zostavax- Once after the age of 60 to prevent Shingles.  Pneumonia shot- Once after the age of 65; if you are younger than 65, ask your healthcare provider if you need a Pneumonia shot.  Take these steps  Don't smoke- If you do smoke, talk to your doctor about quitting.  For tips on how to quit, go to www.smokefree.gov or call 1-800-QUIT-NOW.  Be physically active- Exercise 5 days a week for at least 30 minutes.  If you are not already physically active start slow and gradually work up to 30 minutes of moderate physical activity.  Examples of moderate activity include walking briskly, mowing the yard, dancing, swimming, bicycling, etc.  Eat a healthy diet- Eat a variety of healthy food such as fruits, vegetables, low  fat milk, low fat cheese, yogurt, lean meant, poultry, fish, beans, tofu, etc. For more information go to www.thenutritionsource.org  Drink alcohol in moderation- Limit alcohol intake to less than two drinks a day. Never drink and drive.  Dentist- Brush and floss twice daily; visit your dentist twice a year.  Depression- Your emotional health is as important as your physical health. If you're feeling down, or losing interest in things you would normally enjoy please talk to your healthcare provider.  Eye exam- Visit your eye doctor every year.  Safe sex- If you may be exposed to a sexually transmitted infection, use a condom.  Seat belts- Seat belts can save your life; always wear one.  Smoke/Carbon Monoxide detectors- These detectors need to be installed on the appropriate level of your home.  Replace batteries at least once a year.  Skin cancer- When out in the sun, cover up and use sunscreen 15 SPF or higher.  Violence- If anyone is threatening you, please tell your healthcare provider.  Living Will/ Health care power of attorney- Speak with your healthcare provider and family.    IF you received an x-ray today, you will receive an invoice from [facility]. Please contact [facility] at [phone] with questions or concerns regarding your invoice.   IF you received labwork today, you will receive an invoice from [facility]. Please contact [facility] at [phone] with questions or concerns regarding your invoice.   Our billing staff will not be able to assist you with questions regarding bills from these companies.    You will be contacted with the lab results as soon as they are available. The fastest way to get your results is to activate your My Chart account. Instructions are located on the last page of this paperwork. If you have not heard from us regarding the results in 2 weeks, please contact this office.      

## 2016-05-10 ENCOUNTER — Encounter: Payer: Self-pay | Admitting: Family Medicine

## 2017-04-11 DIAGNOSIS — H6982 Other specified disorders of Eustachian tube, left ear: Secondary | ICD-10-CM | POA: Diagnosis not present

## 2017-04-12 ENCOUNTER — Encounter: Payer: Self-pay | Admitting: Gastroenterology

## 2017-07-17 DIAGNOSIS — L814 Other melanin hyperpigmentation: Secondary | ICD-10-CM | POA: Diagnosis not present

## 2017-07-17 DIAGNOSIS — Z85828 Personal history of other malignant neoplasm of skin: Secondary | ICD-10-CM | POA: Diagnosis not present

## 2017-07-17 DIAGNOSIS — L570 Actinic keratosis: Secondary | ICD-10-CM | POA: Diagnosis not present

## 2017-07-17 DIAGNOSIS — D225 Melanocytic nevi of trunk: Secondary | ICD-10-CM | POA: Diagnosis not present

## 2017-07-17 DIAGNOSIS — D2271 Melanocytic nevi of right lower limb, including hip: Secondary | ICD-10-CM | POA: Diagnosis not present

## 2017-07-17 DIAGNOSIS — L819 Disorder of pigmentation, unspecified: Secondary | ICD-10-CM | POA: Diagnosis not present

## 2017-07-17 DIAGNOSIS — D1801 Hemangioma of skin and subcutaneous tissue: Secondary | ICD-10-CM | POA: Diagnosis not present

## 2017-07-17 DIAGNOSIS — L821 Other seborrheic keratosis: Secondary | ICD-10-CM | POA: Diagnosis not present

## 2017-09-19 DIAGNOSIS — Z0000 Encounter for general adult medical examination without abnormal findings: Secondary | ICD-10-CM | POA: Diagnosis not present

## 2017-09-19 DIAGNOSIS — Z131 Encounter for screening for diabetes mellitus: Secondary | ICD-10-CM | POA: Diagnosis not present

## 2017-09-19 DIAGNOSIS — Z125 Encounter for screening for malignant neoplasm of prostate: Secondary | ICD-10-CM | POA: Diagnosis not present

## 2017-09-19 DIAGNOSIS — Z23 Encounter for immunization: Secondary | ICD-10-CM | POA: Diagnosis not present

## 2017-09-19 DIAGNOSIS — Z136 Encounter for screening for cardiovascular disorders: Secondary | ICD-10-CM | POA: Diagnosis not present

## 2017-09-19 DIAGNOSIS — R6889 Other general symptoms and signs: Secondary | ICD-10-CM | POA: Diagnosis not present

## 2017-11-19 HISTORY — PX: HERNIA REPAIR: SHX51

## 2018-06-23 DIAGNOSIS — M545 Low back pain: Secondary | ICD-10-CM | POA: Diagnosis not present

## 2018-06-30 DIAGNOSIS — M545 Low back pain: Secondary | ICD-10-CM | POA: Diagnosis not present

## 2018-07-29 DIAGNOSIS — K5900 Constipation, unspecified: Secondary | ICD-10-CM | POA: Diagnosis not present

## 2018-08-07 DIAGNOSIS — K4090 Unilateral inguinal hernia, without obstruction or gangrene, not specified as recurrent: Secondary | ICD-10-CM | POA: Diagnosis not present

## 2018-08-21 DIAGNOSIS — L821 Other seborrheic keratosis: Secondary | ICD-10-CM | POA: Diagnosis not present

## 2018-08-21 DIAGNOSIS — D225 Melanocytic nevi of trunk: Secondary | ICD-10-CM | POA: Diagnosis not present

## 2018-08-21 DIAGNOSIS — C44619 Basal cell carcinoma of skin of left upper limb, including shoulder: Secondary | ICD-10-CM | POA: Diagnosis not present

## 2018-08-21 DIAGNOSIS — L570 Actinic keratosis: Secondary | ICD-10-CM | POA: Diagnosis not present

## 2018-08-21 DIAGNOSIS — Z85828 Personal history of other malignant neoplasm of skin: Secondary | ICD-10-CM | POA: Diagnosis not present

## 2018-08-21 DIAGNOSIS — D485 Neoplasm of uncertain behavior of skin: Secondary | ICD-10-CM | POA: Diagnosis not present

## 2018-08-21 DIAGNOSIS — L814 Other melanin hyperpigmentation: Secondary | ICD-10-CM | POA: Diagnosis not present

## 2018-08-28 DIAGNOSIS — K4020 Bilateral inguinal hernia, without obstruction or gangrene, not specified as recurrent: Secondary | ICD-10-CM | POA: Diagnosis not present

## 2018-08-28 DIAGNOSIS — K429 Umbilical hernia without obstruction or gangrene: Secondary | ICD-10-CM | POA: Diagnosis not present

## 2018-08-29 ENCOUNTER — Other Ambulatory Visit: Payer: Self-pay

## 2018-08-29 DIAGNOSIS — R079 Chest pain, unspecified: Secondary | ICD-10-CM | POA: Diagnosis not present

## 2018-08-29 DIAGNOSIS — R1013 Epigastric pain: Secondary | ICD-10-CM | POA: Insufficient documentation

## 2018-08-29 DIAGNOSIS — R110 Nausea: Secondary | ICD-10-CM | POA: Diagnosis not present

## 2018-08-29 NOTE — ED Triage Notes (Signed)
Pt stated "I've had acid reflux for a long time.  I hurt my back in Aug and have been taking a lot of ibuprofen.  Still have the pain after taking Gaviscon.  I have 2 hernias.  Have had sweats a couple of times today."  Pt denies nausea, no radiation of pain.

## 2018-08-30 ENCOUNTER — Emergency Department (HOSPITAL_COMMUNITY)
Admission: EM | Admit: 2018-08-30 | Discharge: 2018-08-30 | Disposition: A | Discharge status: Home / Self Care | Payer: Medicare HMO | Attending: Emergency Medicine | Admitting: Emergency Medicine

## 2018-08-30 ENCOUNTER — Emergency Department (HOSPITAL_COMMUNITY): Payer: Medicare HMO

## 2018-08-30 ENCOUNTER — Encounter (HOSPITAL_COMMUNITY): Payer: Self-pay | Admitting: *Deleted

## 2018-08-30 DIAGNOSIS — R079 Chest pain, unspecified: Secondary | ICD-10-CM | POA: Diagnosis not present

## 2018-08-30 DIAGNOSIS — R1013 Epigastric pain: Secondary | ICD-10-CM | POA: Diagnosis not present

## 2018-08-30 LAB — COMPREHENSIVE METABOLIC PANEL
ALBUMIN: 3.9 g/dL (ref 3.5–5.0)
ALK PHOS: 54 U/L (ref 38–126)
ALT: 16 U/L (ref 0–44)
ANION GAP: 10 (ref 5–15)
AST: 22 U/L (ref 15–41)
BILIRUBIN TOTAL: 0.2 mg/dL — AB (ref 0.3–1.2)
BUN: 21 mg/dL (ref 8–23)
CALCIUM: 9.1 mg/dL (ref 8.9–10.3)
CO2: 28 mmol/L (ref 22–32)
Chloride: 100 mmol/L (ref 98–111)
Creatinine, Ser: 0.94 mg/dL (ref 0.61–1.24)
GFR calc Af Amer: >60 mL/min (ref >60)
GFR calc non Af Amer: >60 mL/min (ref >60)
GLUCOSE: 127 mg/dL — AB (ref 70–99)
POTASSIUM: 3.8 mmol/L (ref 3.5–5.1)
SODIUM: 138 mmol/L (ref 135–145)
TOTAL PROTEIN: 7 g/dL (ref 6.5–8.1)

## 2018-08-30 LAB — POCT I-STAT TROPONIN I: Troponin i, poc: 0 ng/mL (ref 0.00–0.08)

## 2018-08-30 LAB — LIPASE, BLOOD: Lipase: 31 U/L (ref 11–51)

## 2018-08-30 LAB — URINALYSIS, ROUTINE W REFLEX MICROSCOPIC
BILIRUBIN URINE: NEGATIVE
Glucose, UA: NEGATIVE mg/dL
HGB URINE DIPSTICK: NEGATIVE
Ketones, ur: NEGATIVE mg/dL
Leukocytes, UA: NEGATIVE
Nitrite: NEGATIVE
Protein, ur: NEGATIVE mg/dL
SPECIFIC GRAVITY, URINE: 1.033 — AB (ref 1.005–1.030)
pH: 5 (ref 5.0–8.0)

## 2018-08-30 LAB — CBC
HCT: 42.6 % (ref 39.0–52.0)
HEMOGLOBIN: 13.8 g/dL (ref 13.0–17.0)
MCH: 29.5 pg (ref 26.0–34.0)
MCHC: 32.4 g/dL (ref 30.0–36.0)
MCV: 91 fL (ref 80.0–100.0)
Platelets: 185 10*3/uL (ref 150–400)
RBC: 4.68 MIL/uL (ref 4.22–5.81)
RDW: 12.8 % (ref 11.5–15.5)
WBC: 4.3 10*3/uL (ref 4.0–10.5)
nRBC: 0 % (ref 0.0–0.2)

## 2018-08-30 MED ORDER — SUCRALFATE 1 GM/10ML PO SUSP
1.0000 g | Freq: Once | ORAL | Status: AC
  Administered 2018-08-30: 1 g via ORAL
  Filled 2018-08-30: qty 10

## 2018-08-30 MED ORDER — SUCRALFATE 1 GM/10ML PO SUSP: 1.0000 g | Freq: Three times a day (TID) | ORAL | 0 refills | Status: DC

## 2018-08-30 NOTE — ED Provider Notes (Signed)
[facility] DEPT Provider Note   CSN: [id number] Arrival date & time: 08/29/18  2327     History   Chief Complaint Chief Complaint  Patient presents with  . Abdominal Pain    HPI Zachary Burke is a 67 y.o. male.  The history is provided by the patient and the spouse.  Abdominal Pain   This is a new problem. The current episode started 6 to 12 hours ago. The problem occurs constantly. The problem has been gradually worsening. The pain is located in the epigastric region. The pain is moderate. Associated symptoms include nausea. Pertinent negatives include fever, diarrhea, hematochezia, melena, vomiting and constipation. The symptoms are aggravated by palpation. Nothing relieves the symptoms.  Patient with history of GERD and hiatal hernia presents with upper abdominal pain.  He reports it started several hours ago and is worsening.  The pain does not radiate.  No vomiting or fevers.  No recent black or bloody stools.  He is not constipated.  No diarrhea.  No chest pain or shortness of breath at this time. Takes ibuprofen daily for chronic back pain.  Reports has had similar pain before and thought it might be an ulcer usually resolves with prilosec.  He has had endoscopies before.  Past Medical History:  Diagnosis Date  . Basal cell carcinoma   . Colon polyps    2002,2006  . GERD (gastroesophageal reflux disease)   . Hiatal hernia     Patient Active Problem List   Diagnosis Date Noted  . Esophageal reflux 08/21/2010  . Personal history of colonic polyps 08/21/2010    Past Surgical History:  Procedure Laterality Date  . NASAL SEPTUM SURGERY          Home Medications    Prior to Admission medications   Medication Sig Start Date End Date Taking? Authorizing Provider  sucralfate (CARAFATE) 1 GM/10ML suspension Take 10 mLs (1 g total) by mouth 4 (four) times daily -  with meals and at bedtime. 08/30/18   Ripley Fraise, MD    Family  History Family History  Problem Relation Age of Onset  . Prostate cancer Paternal Uncle   . Esophageal cancer Paternal Grandfather   . Colon polyps Mother   . Colon polyps Father   . Colon cancer Paternal Aunt     Social History Social History   Tobacco Use  . Smoking status: Never Smoker  . Smokeless tobacco: Never Used  Substance Use Topics  . Alcohol use: No  . Drug use: No     Allergies   Patient has no allergy information on record.   Review of Systems Review of Systems  Constitutional: Negative for fever.  Respiratory: Negative for shortness of breath.   Cardiovascular: Negative for chest pain.  Gastrointestinal: Positive for abdominal pain and nausea. Negative for blood in stool, constipation, diarrhea, hematochezia, melena and vomiting.  All other systems reviewed and are negative.    Physical Exam Updated Vital Signs BP 115/75 (BP Location: Right Arm)   Pulse 71   Temp 98.1 F (36.7 C) (Oral)   Resp 14   Ht 1.829 m (6')   Wt 81.6 kg   SpO2 99%   BMI 24.41 kg/m   Physical Exam CONSTITUTIONAL: Well developed/well nourished HEAD: Normocephalic/atraumatic EYES: EOMI/PERRL, no icterus ENMT: Mucous membranes moist NECK: supple no meningeal signs SPINE/BACK:entire spine nontender CV: S1/S2 noted, no murmurs/rubs/gallops noted LUNGS: Lungs are clear to auscultation bilaterally, no apparent distress ABDOMEN: soft, nontender, no rebound or  guarding, bowel sounds noted throughout abdomen GU:no cva tenderness NEURO: Pt is awake/alert/appropriate, moves all extremitiesx4.  No facial droop.   EXTREMITIES: pulses normal/equal, full ROM SKIN: warm, color normal PSYCH: no abnormalities of mood noted, alert and oriented to situation   ED Treatments / Results  Labs (all labs ordered are listed, but only abnormal results are displayed) Labs Reviewed  COMPREHENSIVE METABOLIC PANEL - Abnormal; Notable for the following components:      Result Value    Glucose, Bld 127 (*)    Total Bilirubin 0.2 (*)    All other components within normal limits  URINALYSIS, ROUTINE W REFLEX MICROSCOPIC - Abnormal; Notable for the following components:   Specific Gravity, Urine 1.033 (*)    All other components within normal limits  LIPASE, BLOOD  CBC  POCT I-STAT TROPONIN I    EKG EKG Interpretation  Date/Time:  Friday August 29 2018 23:35:15 EDT Ventricular Rate:  77 PR Interval:    QRS Duration: 93 QT Interval:  375 QTC Calculation: 425 R Axis:   77 Text Interpretation:  Sinus rhythm Baseline wander in lead(s) I II aVR No previous ECGs available Confirmed by Ripley Fraise [phone]) on 08/30/2018 1:33:22 AM   Radiology Dg Chest 2 View  Result Date: 08/30/2018 CLINICAL DATA:  66 year old male with history of mid chest pain. EXAM: CHEST - 2 VIEW COMPARISON:  Chest x-ray 12/06/2014. FINDINGS: Lung volumes are normal. No consolidative airspace disease. No pleural effusions. No pneumothorax. No pulmonary nodule or mass noted. Pulmonary vasculature and the cardiomediastinal silhouette are within normal limits. IMPRESSION: No radiographic evidence of acute cardiopulmonary disease. Electronically Signed   By: Vinnie Langton M.D.   On: 08/30/2018 00:44    Procedures Procedures  Medications Ordered in ED Medications  sucralfate (CARAFATE) 1 GM/10ML suspension 1 g (1 g Oral Given 08/30/18 0147)     Initial Impression / Assessment and Plan / ED Course  I have reviewed the triage vital signs and the nursing notes.  Pertinent labs & imaging results that were available during my care of the patient were reviewed by me and considered in my medical decision making (see chart for details).     PT presented with upper abdominal pain.  He reports good relief previously with Carafate.  This was given in the ER and his pain is nearly resolved. Labs reassuring No focal tenderness, defer imaging at this time.  He denies any chest pain or shortness of  breath. Low suspicion for ACS at this time. Previous endoscopy has shown erythema in stomach, but no known ulcer.  But it has been up to 8 years since last EGD Patient will stop NSAIDs, start Carafate, and follow-up with PCP and GI. Discussed strict return precautions  Final Clinical Impressions(s) / ED Diagnoses   Final diagnoses:  Epigastric pain    ED Discharge Orders         Ordered    sucralfate (CARAFATE) 1 GM/10ML suspension  3 times daily with meals & bedtime     08/30/18 0231           Ripley Fraise, MD 08/30/18 0246

## 2018-09-02 DIAGNOSIS — M545 Low back pain: Secondary | ICD-10-CM | POA: Diagnosis not present

## 2018-09-25 DIAGNOSIS — Z23 Encounter for immunization: Secondary | ICD-10-CM | POA: Diagnosis not present

## 2018-09-25 DIAGNOSIS — Z0000 Encounter for general adult medical examination without abnormal findings: Secondary | ICD-10-CM | POA: Diagnosis not present

## 2018-09-25 DIAGNOSIS — Z131 Encounter for screening for diabetes mellitus: Secondary | ICD-10-CM | POA: Diagnosis not present

## 2018-09-25 DIAGNOSIS — Z125 Encounter for screening for malignant neoplasm of prostate: Secondary | ICD-10-CM | POA: Diagnosis not present

## 2018-10-06 DIAGNOSIS — K4020 Bilateral inguinal hernia, without obstruction or gangrene, not specified as recurrent: Secondary | ICD-10-CM | POA: Diagnosis not present

## 2018-10-06 DIAGNOSIS — K429 Umbilical hernia without obstruction or gangrene: Secondary | ICD-10-CM | POA: Diagnosis not present

## 2018-10-10 ENCOUNTER — Encounter: Payer: Self-pay | Admitting: Gastroenterology

## 2018-11-03 ENCOUNTER — Other Ambulatory Visit: Payer: Self-pay | Admitting: Sports Medicine

## 2018-11-03 DIAGNOSIS — M545 Low back pain, unspecified: Secondary | ICD-10-CM

## 2018-11-04 DIAGNOSIS — R1013 Epigastric pain: Secondary | ICD-10-CM | POA: Diagnosis not present

## 2018-11-04 DIAGNOSIS — R194 Change in bowel habit: Secondary | ICD-10-CM | POA: Diagnosis not present

## 2018-11-04 DIAGNOSIS — K625 Hemorrhage of anus and rectum: Secondary | ICD-10-CM | POA: Diagnosis not present

## 2018-11-04 DIAGNOSIS — R140 Abdominal distension (gaseous): Secondary | ICD-10-CM | POA: Diagnosis not present

## 2018-11-04 DIAGNOSIS — R634 Abnormal weight loss: Secondary | ICD-10-CM | POA: Diagnosis not present

## 2018-11-09 ENCOUNTER — Ambulatory Visit
Admission: RE | Admit: 2018-11-09 | Discharge: 2018-11-09 | Disposition: A | Discharge status: Home / Self Care | Payer: Medicare HMO | Source: Ambulatory Visit | Attending: Sports Medicine | Admitting: Sports Medicine

## 2018-11-09 DIAGNOSIS — M48061 Spinal stenosis, lumbar region without neurogenic claudication: Secondary | ICD-10-CM | POA: Diagnosis not present

## 2018-11-09 DIAGNOSIS — M545 Low back pain, unspecified: Secondary | ICD-10-CM

## 2018-11-14 DIAGNOSIS — M545 Low back pain: Secondary | ICD-10-CM | POA: Diagnosis not present

## 2018-11-21 DIAGNOSIS — M545 Low back pain: Secondary | ICD-10-CM | POA: Diagnosis not present

## 2018-11-21 DIAGNOSIS — M48061 Spinal stenosis, lumbar region without neurogenic claudication: Secondary | ICD-10-CM | POA: Diagnosis not present

## 2018-11-21 DIAGNOSIS — M5186 Other intervertebral disc disorders, lumbar region: Secondary | ICD-10-CM | POA: Diagnosis not present

## 2018-11-26 DIAGNOSIS — M48061 Spinal stenosis, lumbar region without neurogenic claudication: Secondary | ICD-10-CM | POA: Diagnosis not present

## 2018-11-26 DIAGNOSIS — M545 Low back pain: Secondary | ICD-10-CM | POA: Diagnosis not present

## 2018-11-26 DIAGNOSIS — M5186 Other intervertebral disc disorders, lumbar region: Secondary | ICD-10-CM | POA: Diagnosis not present

## 2018-11-28 ENCOUNTER — Ambulatory Visit: Payer: Medicare HMO | Admitting: Gastroenterology

## 2018-11-28 DIAGNOSIS — J069 Acute upper respiratory infection, unspecified: Secondary | ICD-10-CM | POA: Diagnosis not present

## 2018-12-03 DIAGNOSIS — M48061 Spinal stenosis, lumbar region without neurogenic claudication: Secondary | ICD-10-CM | POA: Diagnosis not present

## 2018-12-03 DIAGNOSIS — M545 Low back pain: Secondary | ICD-10-CM | POA: Diagnosis not present

## 2018-12-03 DIAGNOSIS — M5186 Other intervertebral disc disorders, lumbar region: Secondary | ICD-10-CM | POA: Diagnosis not present

## 2018-12-15 DIAGNOSIS — D125 Benign neoplasm of sigmoid colon: Secondary | ICD-10-CM | POA: Diagnosis not present

## 2018-12-15 DIAGNOSIS — K635 Polyp of colon: Secondary | ICD-10-CM | POA: Diagnosis not present

## 2018-12-15 DIAGNOSIS — K228 Other specified diseases of esophagus: Secondary | ICD-10-CM | POA: Diagnosis not present

## 2018-12-15 DIAGNOSIS — D123 Benign neoplasm of transverse colon: Secondary | ICD-10-CM | POA: Diagnosis not present

## 2018-12-15 DIAGNOSIS — K317 Polyp of stomach and duodenum: Secondary | ICD-10-CM | POA: Diagnosis not present

## 2018-12-15 DIAGNOSIS — R1013 Epigastric pain: Secondary | ICD-10-CM | POA: Diagnosis not present

## 2018-12-15 DIAGNOSIS — K621 Rectal polyp: Secondary | ICD-10-CM | POA: Diagnosis not present

## 2018-12-15 DIAGNOSIS — K2930 Chronic superficial gastritis without bleeding: Secondary | ICD-10-CM | POA: Diagnosis not present

## 2018-12-15 DIAGNOSIS — K625 Hemorrhage of anus and rectum: Secondary | ICD-10-CM | POA: Diagnosis not present

## 2018-12-15 LAB — HM COLONOSCOPY

## 2018-12-18 DIAGNOSIS — D123 Benign neoplasm of transverse colon: Secondary | ICD-10-CM | POA: Diagnosis not present

## 2018-12-18 DIAGNOSIS — K621 Rectal polyp: Secondary | ICD-10-CM | POA: Diagnosis not present

## 2018-12-18 DIAGNOSIS — R0781 Pleurodynia: Secondary | ICD-10-CM | POA: Diagnosis not present

## 2018-12-18 DIAGNOSIS — D125 Benign neoplasm of sigmoid colon: Secondary | ICD-10-CM | POA: Diagnosis not present

## 2018-12-18 DIAGNOSIS — K317 Polyp of stomach and duodenum: Secondary | ICD-10-CM | POA: Diagnosis not present

## 2018-12-18 DIAGNOSIS — K228 Other specified diseases of esophagus: Secondary | ICD-10-CM | POA: Diagnosis not present

## 2018-12-18 DIAGNOSIS — K2930 Chronic superficial gastritis without bleeding: Secondary | ICD-10-CM | POA: Diagnosis not present

## 2018-12-18 DIAGNOSIS — K635 Polyp of colon: Secondary | ICD-10-CM | POA: Diagnosis not present

## 2018-12-25 DIAGNOSIS — Z85828 Personal history of other malignant neoplasm of skin: Secondary | ICD-10-CM | POA: Diagnosis not present

## 2018-12-25 DIAGNOSIS — L570 Actinic keratosis: Secondary | ICD-10-CM | POA: Diagnosis not present

## 2019-03-20 DIAGNOSIS — R5383 Other fatigue: Secondary | ICD-10-CM | POA: Diagnosis not present

## 2019-03-20 DIAGNOSIS — Z7189 Other specified counseling: Secondary | ICD-10-CM | POA: Diagnosis not present

## 2019-03-20 DIAGNOSIS — R0600 Dyspnea, unspecified: Secondary | ICD-10-CM | POA: Diagnosis not present

## 2019-03-20 DIAGNOSIS — Z20828 Contact with and (suspected) exposure to other viral communicable diseases: Secondary | ICD-10-CM | POA: Diagnosis not present

## 2019-08-13 DIAGNOSIS — R69 Illness, unspecified: Secondary | ICD-10-CM | POA: Diagnosis not present

## 2019-10-07 DIAGNOSIS — Z125 Encounter for screening for malignant neoplasm of prostate: Secondary | ICD-10-CM | POA: Diagnosis not present

## 2019-10-07 DIAGNOSIS — Z0000 Encounter for general adult medical examination without abnormal findings: Secondary | ICD-10-CM | POA: Diagnosis not present

## 2019-10-21 DIAGNOSIS — D1801 Hemangioma of skin and subcutaneous tissue: Secondary | ICD-10-CM | POA: Diagnosis not present

## 2019-10-21 DIAGNOSIS — B356 Tinea cruris: Secondary | ICD-10-CM | POA: Diagnosis not present

## 2019-10-21 DIAGNOSIS — L821 Other seborrheic keratosis: Secondary | ICD-10-CM | POA: Diagnosis not present

## 2019-10-21 DIAGNOSIS — L814 Other melanin hyperpigmentation: Secondary | ICD-10-CM | POA: Diagnosis not present

## 2019-10-21 DIAGNOSIS — L570 Actinic keratosis: Secondary | ICD-10-CM | POA: Diagnosis not present

## 2019-10-21 DIAGNOSIS — Z85828 Personal history of other malignant neoplasm of skin: Secondary | ICD-10-CM | POA: Diagnosis not present

## 2019-10-21 DIAGNOSIS — B353 Tinea pedis: Secondary | ICD-10-CM | POA: Diagnosis not present

## 2019-10-27 DIAGNOSIS — M25511 Pain in right shoulder: Secondary | ICD-10-CM | POA: Diagnosis not present

## 2019-10-28 DIAGNOSIS — S46011D Strain of muscle(s) and tendon(s) of the rotator cuff of right shoulder, subsequent encounter: Secondary | ICD-10-CM | POA: Diagnosis not present

## 2019-10-28 DIAGNOSIS — M7541 Impingement syndrome of right shoulder: Secondary | ICD-10-CM | POA: Diagnosis not present

## 2019-10-28 DIAGNOSIS — M6281 Muscle weakness (generalized): Secondary | ICD-10-CM | POA: Diagnosis not present

## 2019-10-28 DIAGNOSIS — M25611 Stiffness of right shoulder, not elsewhere classified: Secondary | ICD-10-CM | POA: Diagnosis not present

## 2019-11-02 DIAGNOSIS — H2513 Age-related nuclear cataract, bilateral: Secondary | ICD-10-CM | POA: Diagnosis not present

## 2019-11-02 DIAGNOSIS — H524 Presbyopia: Secondary | ICD-10-CM | POA: Diagnosis not present

## 2019-11-06 DIAGNOSIS — M25611 Stiffness of right shoulder, not elsewhere classified: Secondary | ICD-10-CM | POA: Diagnosis not present

## 2019-11-06 DIAGNOSIS — M7541 Impingement syndrome of right shoulder: Secondary | ICD-10-CM | POA: Diagnosis not present

## 2019-11-06 DIAGNOSIS — M6281 Muscle weakness (generalized): Secondary | ICD-10-CM | POA: Diagnosis not present

## 2019-11-06 DIAGNOSIS — S46011D Strain of muscle(s) and tendon(s) of the rotator cuff of right shoulder, subsequent encounter: Secondary | ICD-10-CM | POA: Diagnosis not present

## 2019-11-09 DIAGNOSIS — R6889 Other general symptoms and signs: Secondary | ICD-10-CM | POA: Diagnosis not present

## 2019-11-09 DIAGNOSIS — R531 Weakness: Secondary | ICD-10-CM | POA: Diagnosis not present

## 2019-11-10 DIAGNOSIS — R6889 Other general symptoms and signs: Secondary | ICD-10-CM | POA: Diagnosis not present

## 2019-11-30 IMAGING — CR DG CHEST 2V
2 series · 2 of 2 positions shown · non-contrast
Comparison: Chest x-ray 12/06/2014.

CLINICAL DATA: 67-year-old male with history of mid chest pain.

EXAM:
CHEST - 2 VIEW

[w chest pa]
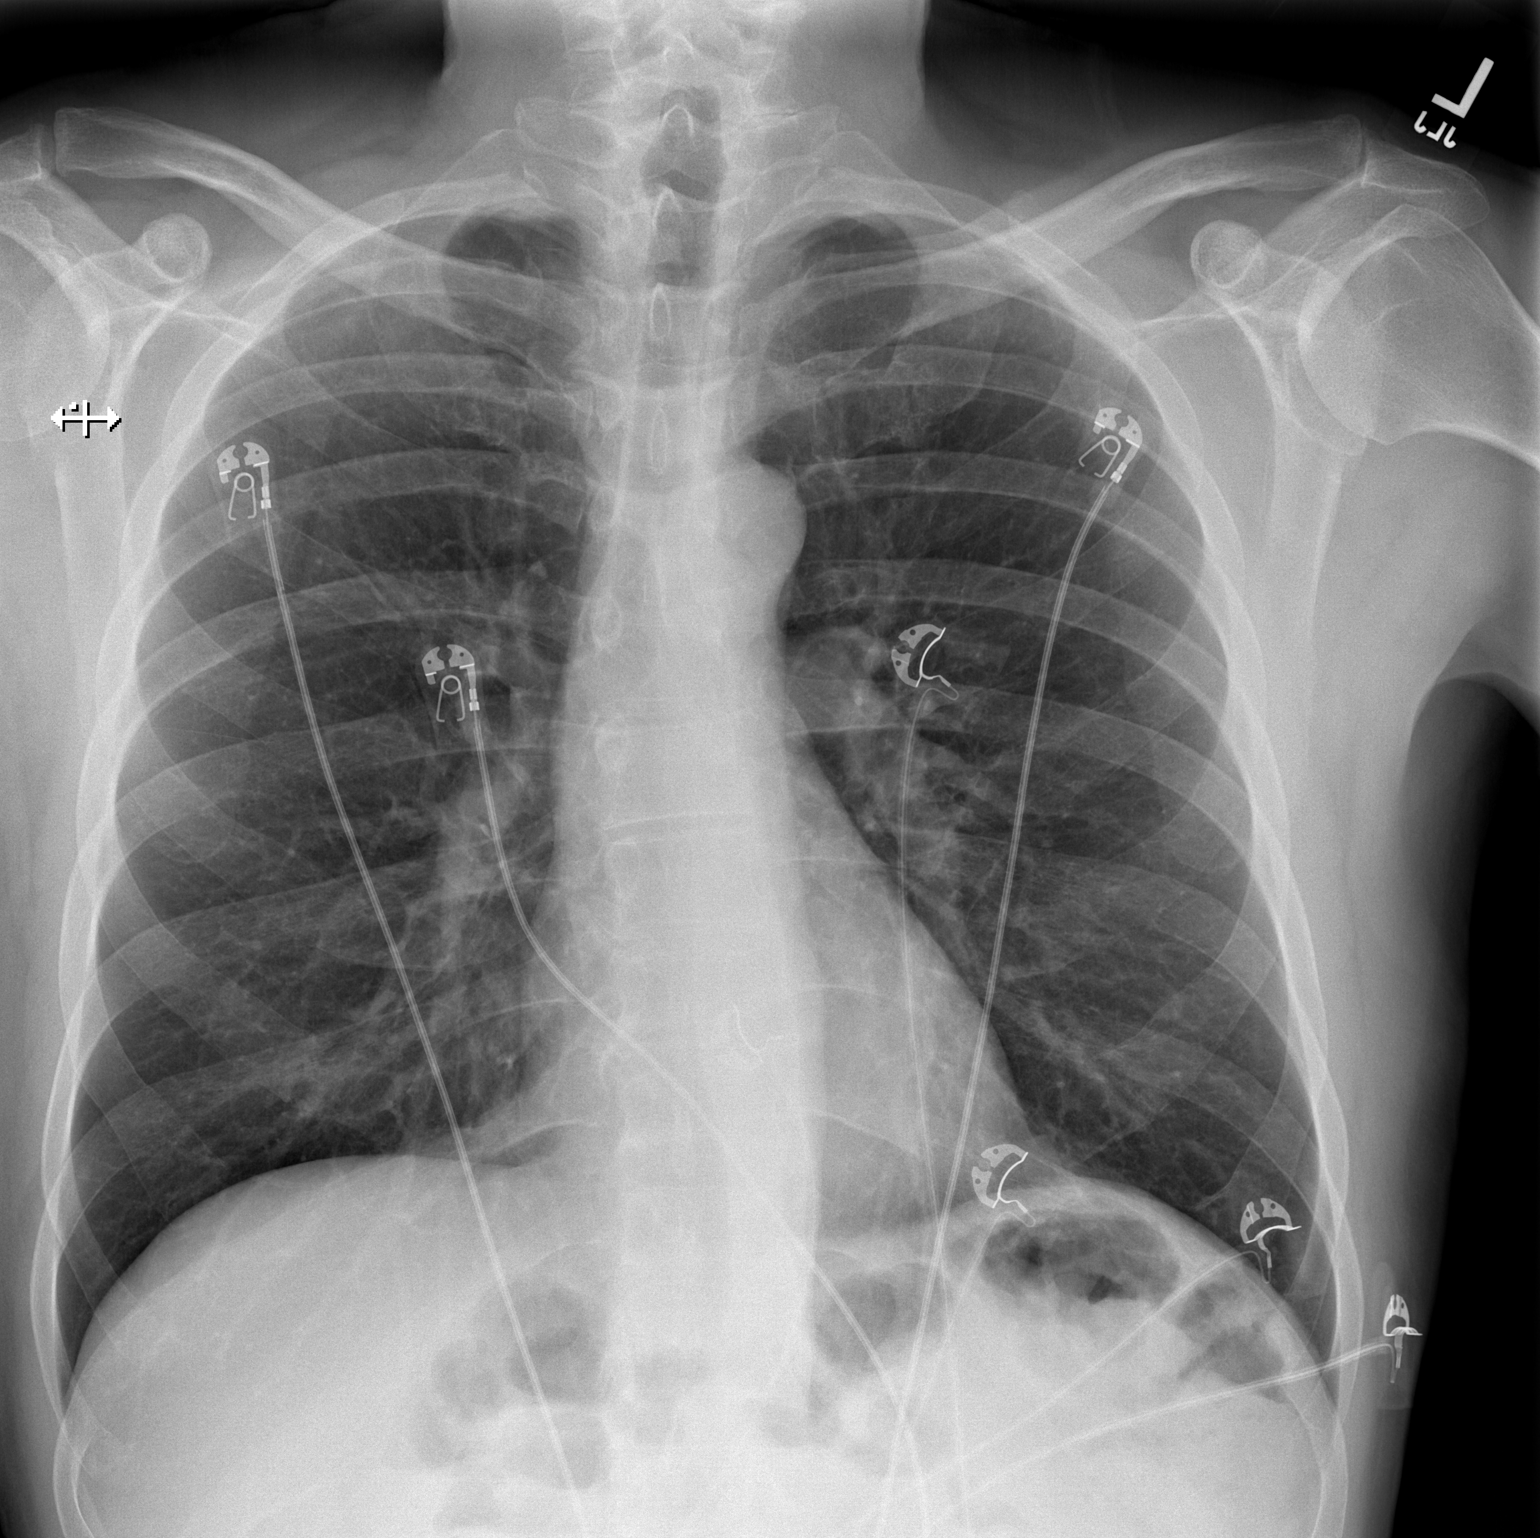

[w chest lat]
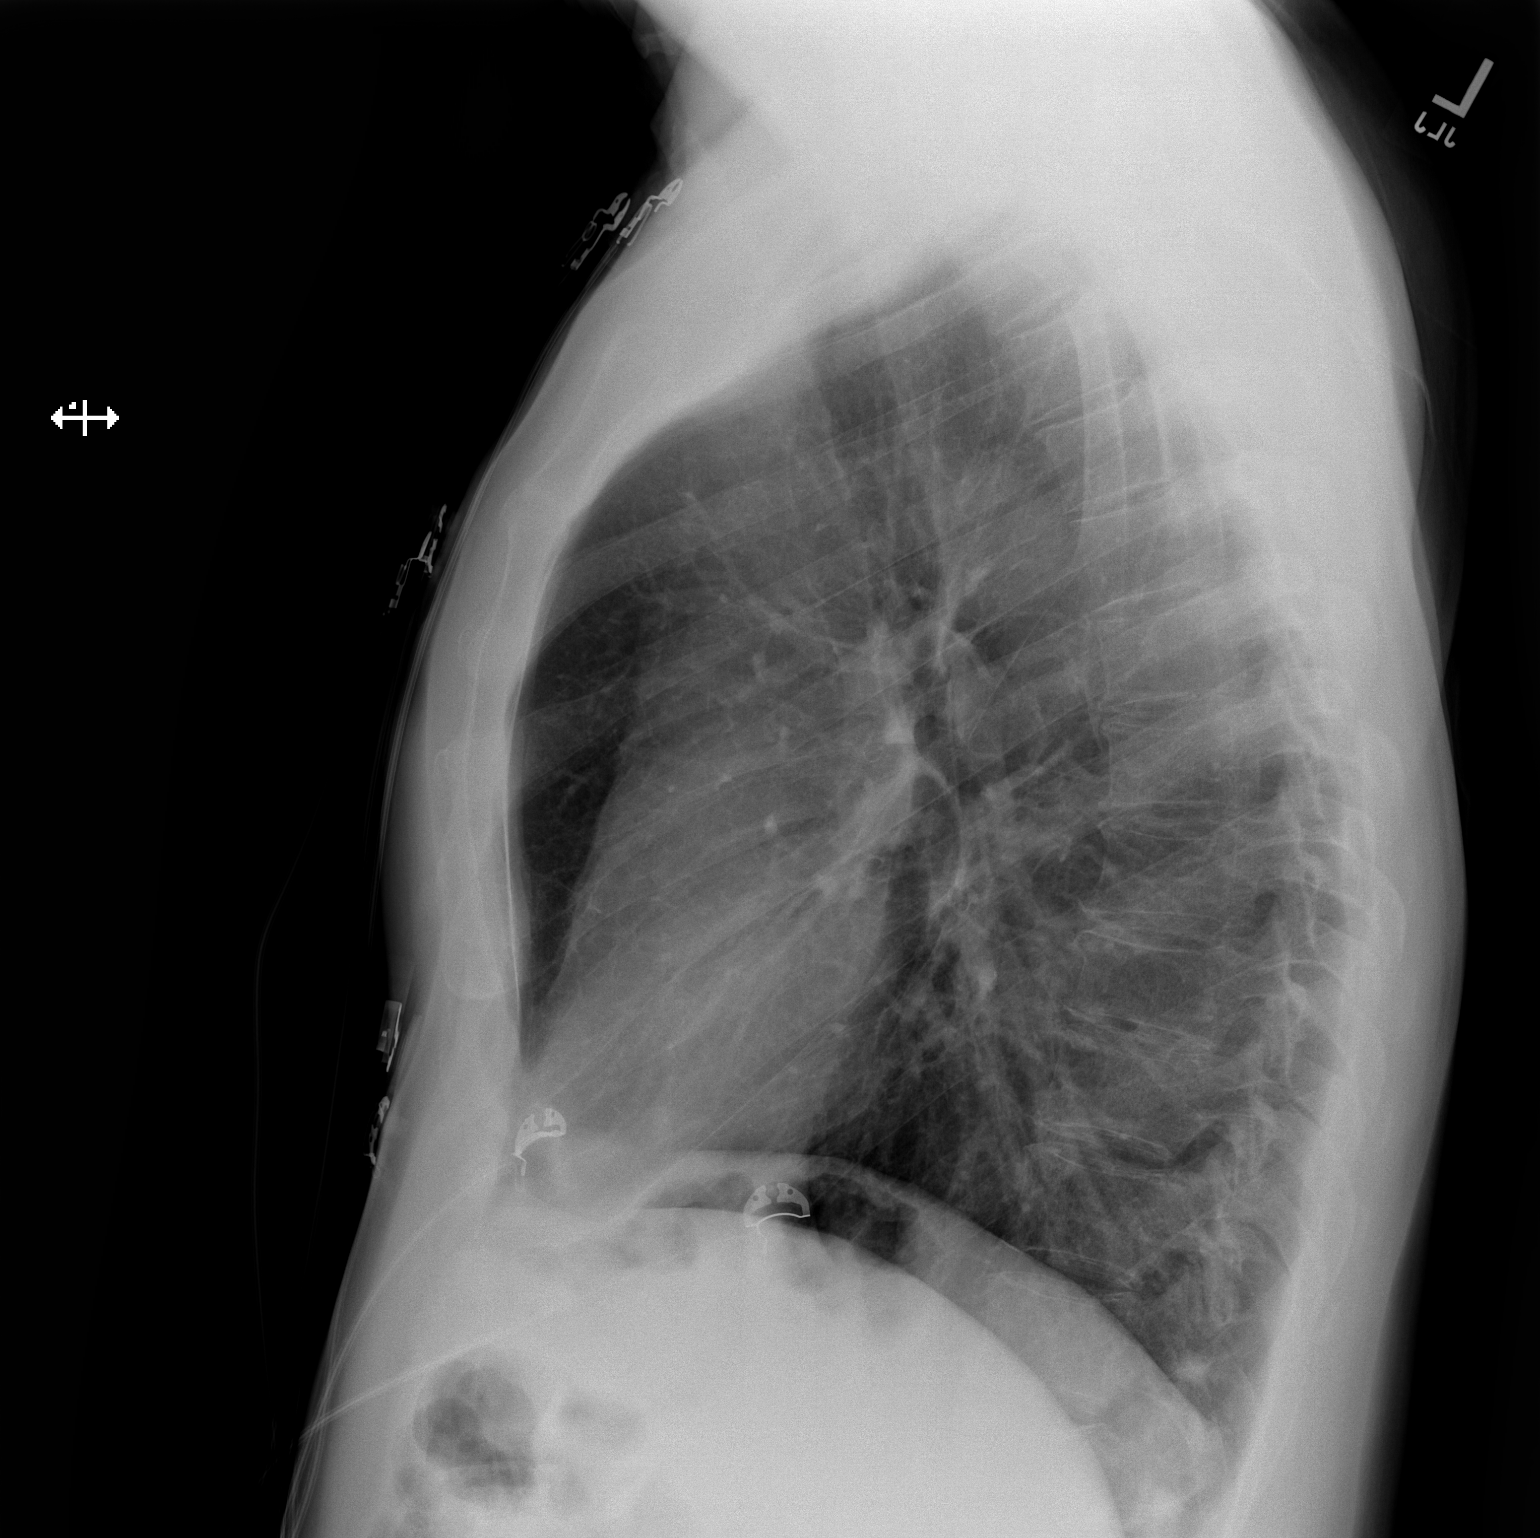

[2 of 2 positions shown; findings below may reference images not displayed]

FINDINGS: Lung volumes are normal. No consolidative airspace disease. No
pleural effusions. No pneumothorax. No pulmonary nodule or mass
noted. Pulmonary vasculature and the cardiomediastinal silhouette
are within normal limits.
IMPRESSION: No radiographic evidence of acute cardiopulmonary disease.

## 2019-12-08 DIAGNOSIS — M25611 Stiffness of right shoulder, not elsewhere classified: Secondary | ICD-10-CM | POA: Diagnosis not present

## 2019-12-31 ENCOUNTER — Ambulatory Visit: Payer: Medicare HMO

## 2020-02-09 IMAGING — MR MR LUMBAR SPINE W/O CM
4 of 5 series · 26 of 48 positions shown · non-contrast
Comparison: None.

CLINICAL DATA: Acute low back pain without sciatica

EXAM:
MRI LUMBAR SPINE WITHOUT CONTRAST
TECHNIQUE: Multiplanar, multisequence MR imaging of the lumbar spine was
performed. No intravenous contrast was administered.

[Series 4: T1 · sagittal · 4.0mm · 0.59mm/px · 5 of 13 slices shown (1 of 2)]
[im 1/13]
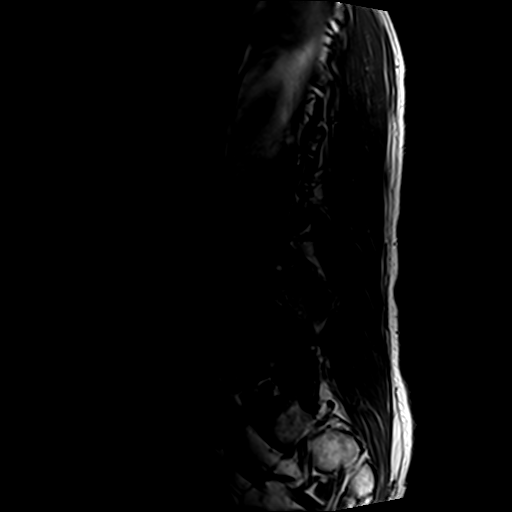
[im 4/13]
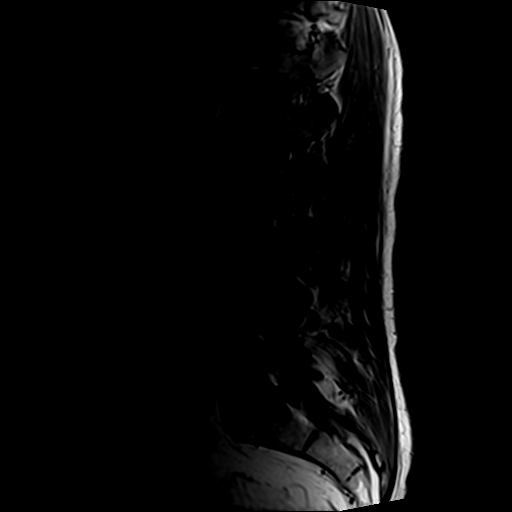
[im 7/13]
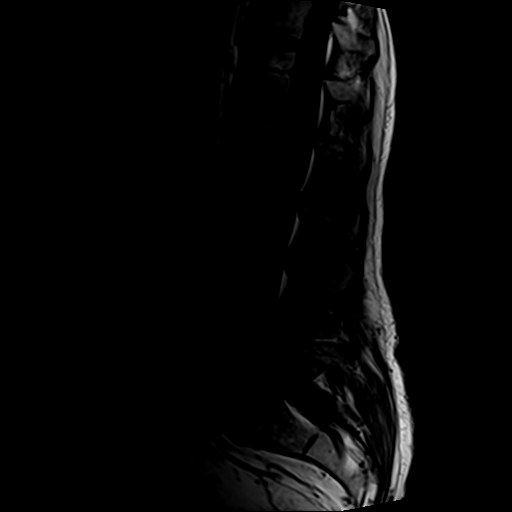
[im 10/13]
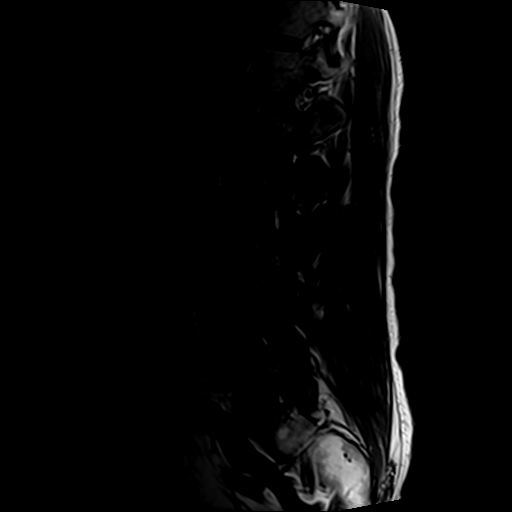
[im 13/13]
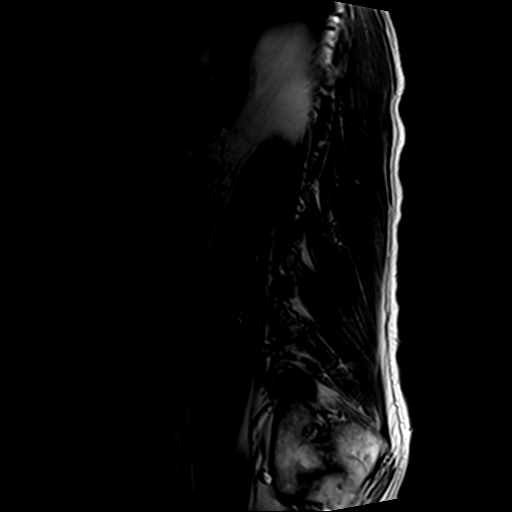

[Series 5: T2 post-contrast · sagittal · 4.0mm · 0.59mm/px · 5 of 13 slices shown]
[im 1/13]
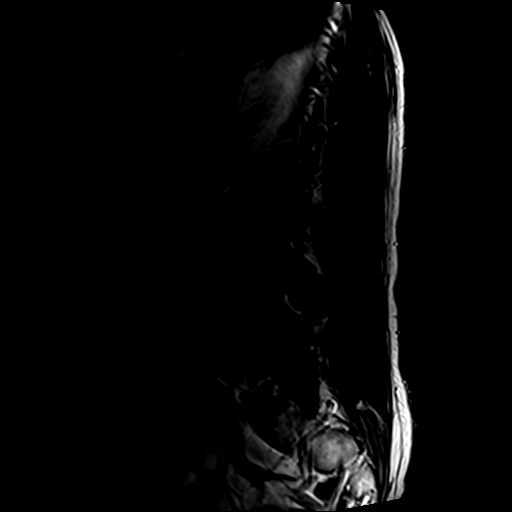
[im 4/13]
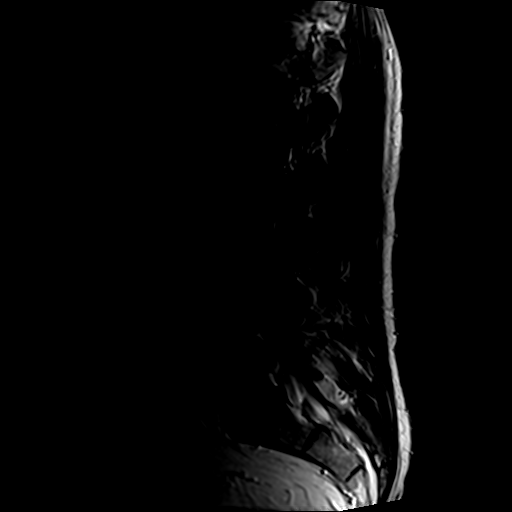
[im 7/13]
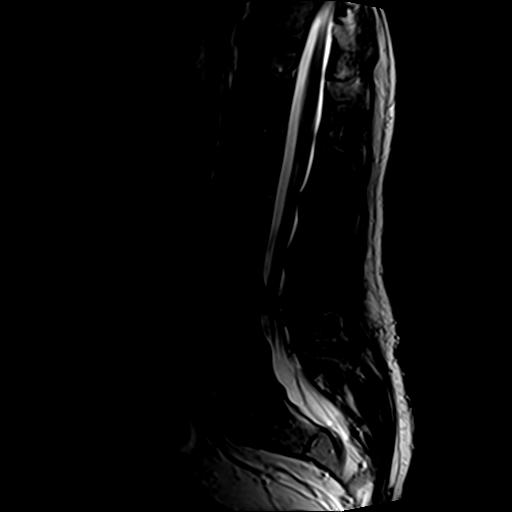
[im 10/13]
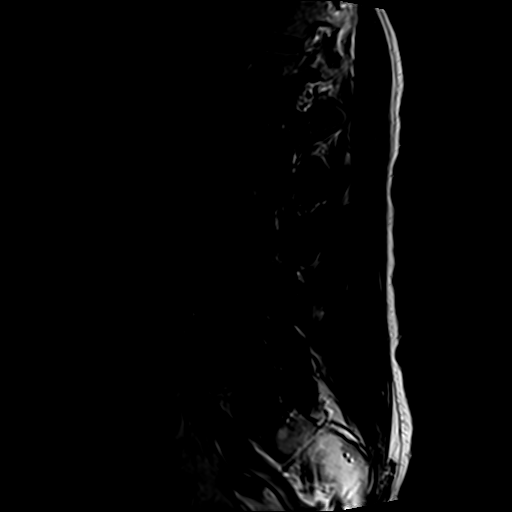
[im 13/13]
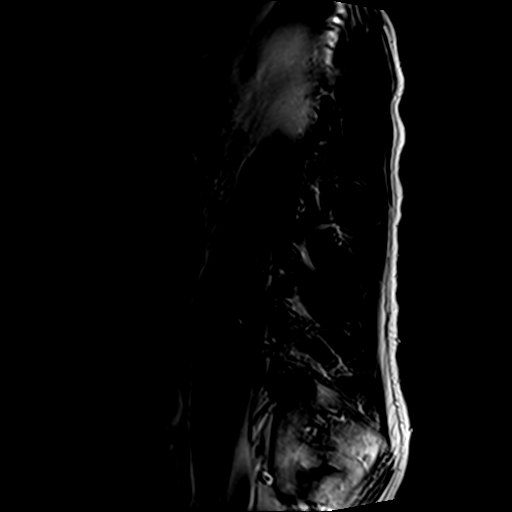

[Series 6: T2 · axial · 4.0mm · 0.70mm/px · z∈[-137,+79]mm · 10 of 40 slices shown]
[im 3/40]
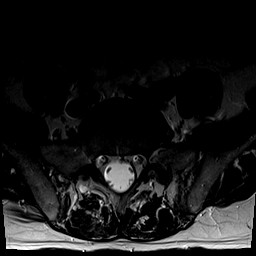
[im 6/40]
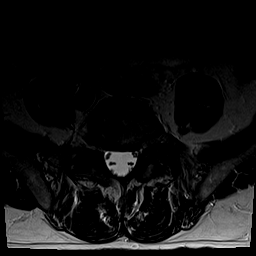
[im 8/40]
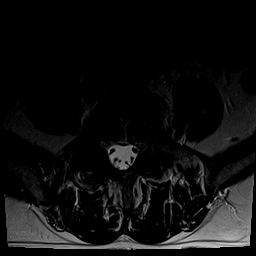
[im 14/40]
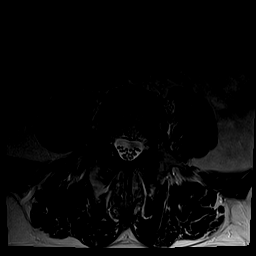
[im 19/40]
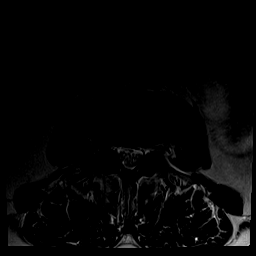
[im 21/40]
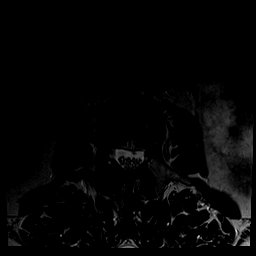
[im 24/40]
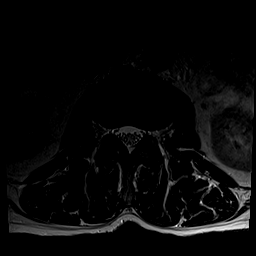
[im 29/40]
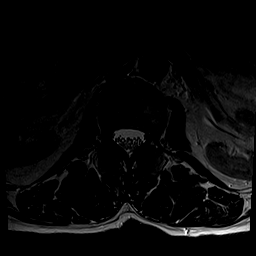
[im 34/40]
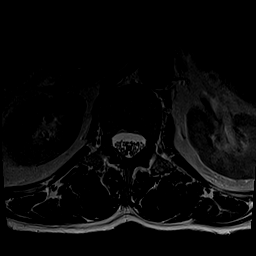
[im 40/40]
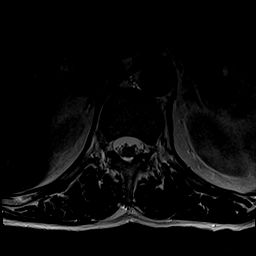

[Series 7: T1 · axial · 4.0mm · 0.35mm/px · z∈[-137,+48]mm · 6 of 40 slices shown (2 of 2)]
[im 3/40]
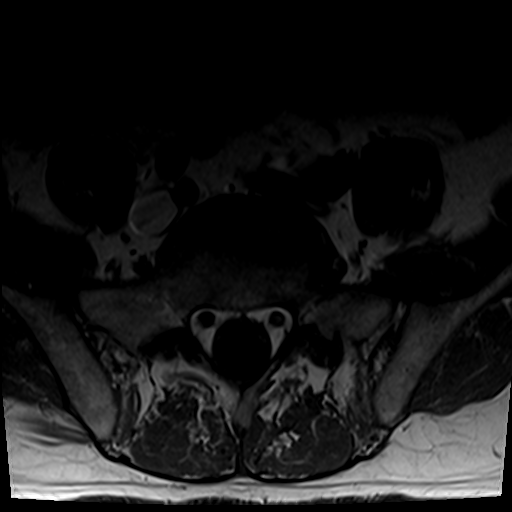
[im 6/40]
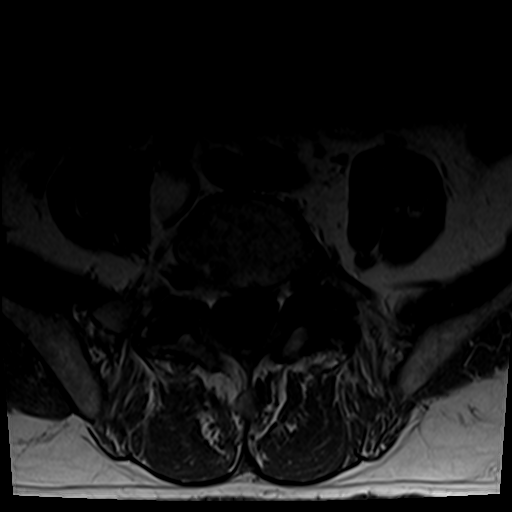
[im 8/40]
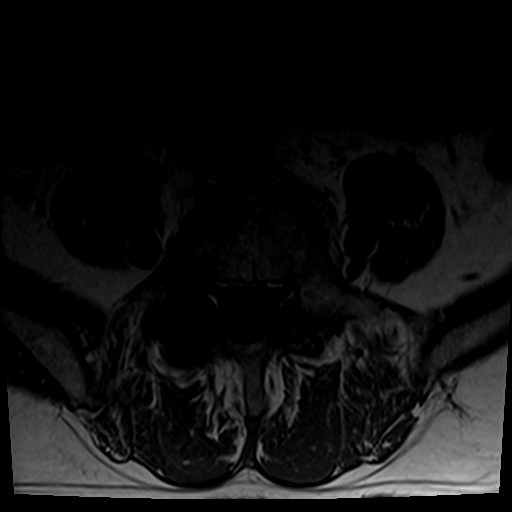
[im 14/40]
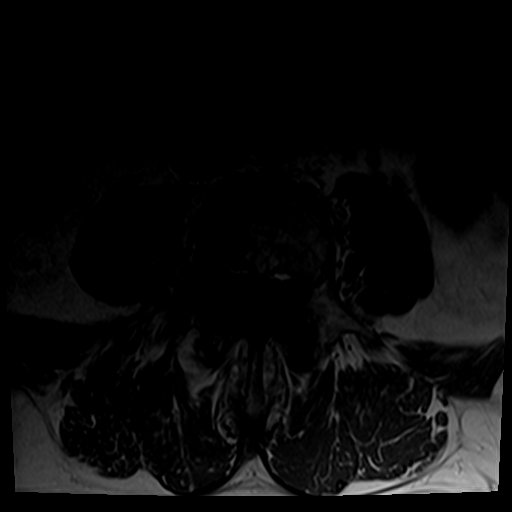
[im 21/40]
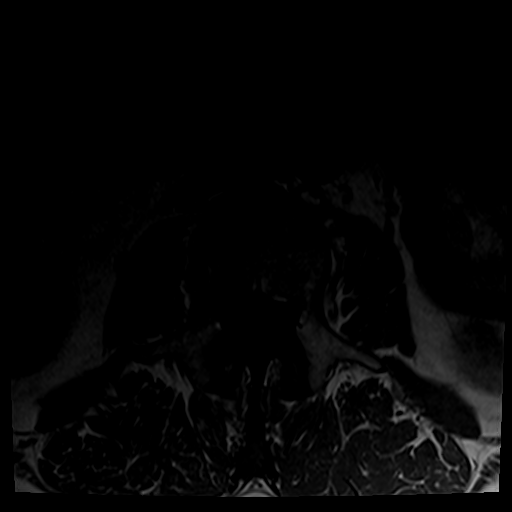
[im 34/40]
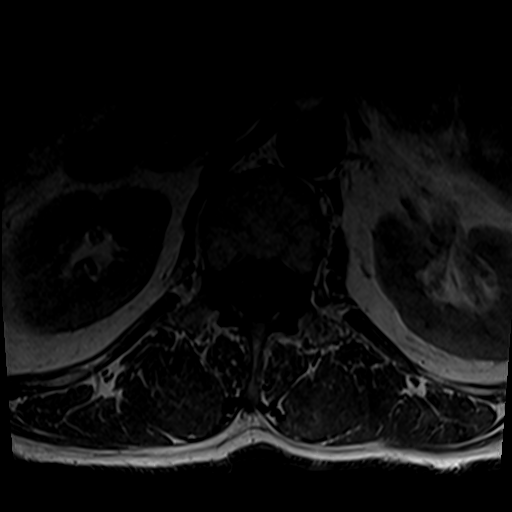

[26 of 48 positions shown; findings below may reference images not displayed]

FINDINGS: Segmentation:  Normal

Alignment:  Slight retrolisthesis L3-4. Mild anterolisthesis L4-5

Vertebrae:  Negative for fracture or mass.

Conus medullaris and cauda equina: Conus extends to the L1-2 level.
Conus and cauda equina appear normal.

Paraspinal and other soft tissues: Negative for paraspinous mass or
adenopathy

Disc levels:

L1-2: Negative

L2-3: Normal disc space.  Mild facet degeneration without stenosis

L3-4: Disc degeneration with disc bulging and endplate spurring.
Moderate facet hypertrophy bilaterally. Moderate spinal stenosis.
Moderate subarticular and foraminal stenosis bilaterally.

L4-5: Mild anterolisthesis. Small left-sided disc protrusion with
subarticular stenosis and possible impingement of the left L5 nerve
root. Moderate to advanced facet hypertrophy bilaterally with mild
spinal stenosis. Moderate foraminal stenosis bilaterally

L5-S1: Shallow central disc protrusion without significant neural
impingement
IMPRESSION: Moderate spinal stenosis L3-4 with moderate subarticular and
foraminal stenosis bilaterally

Small left-sided disc protrusion L4-5 with possible impingement left
L5 nerve root. Mild spinal stenosis and moderate foraminal stenosis
bilaterally

Small central disc protrusion L5-S1 without neural impingement.

## 2020-04-05 DIAGNOSIS — R69 Illness, unspecified: Secondary | ICD-10-CM | POA: Diagnosis not present

## 2020-04-28 DIAGNOSIS — L821 Other seborrheic keratosis: Secondary | ICD-10-CM | POA: Diagnosis not present

## 2020-04-28 DIAGNOSIS — L570 Actinic keratosis: Secondary | ICD-10-CM | POA: Diagnosis not present

## 2020-04-28 DIAGNOSIS — Z85828 Personal history of other malignant neoplasm of skin: Secondary | ICD-10-CM | POA: Diagnosis not present

## 2020-04-28 DIAGNOSIS — L820 Inflamed seborrheic keratosis: Secondary | ICD-10-CM | POA: Diagnosis not present

## 2020-05-10 DIAGNOSIS — R69 Illness, unspecified: Secondary | ICD-10-CM | POA: Diagnosis not present

## 2020-07-12 ENCOUNTER — Encounter: Payer: Self-pay | Admitting: Internal Medicine

## 2020-07-12 ENCOUNTER — Ambulatory Visit (INDEPENDENT_AMBULATORY_CARE_PROVIDER_SITE_OTHER): Payer: Medicare HMO | Admitting: Internal Medicine

## 2020-07-12 ENCOUNTER — Other Ambulatory Visit: Payer: Self-pay

## 2020-07-12 VITALS — BP 136/84 | HR 76 | Temp 98.5°F | Resp 16 | Ht 71.5 in | Wt 183.0 lb

## 2020-07-12 DIAGNOSIS — N400 Enlarged prostate without lower urinary tract symptoms: Secondary | ICD-10-CM

## 2020-07-12 DIAGNOSIS — R030 Elevated blood-pressure reading, without diagnosis of hypertension: Secondary | ICD-10-CM | POA: Diagnosis not present

## 2020-07-12 DIAGNOSIS — N5201 Erectile dysfunction due to arterial insufficiency: Secondary | ICD-10-CM | POA: Insufficient documentation

## 2020-07-12 DIAGNOSIS — K279 Peptic ulcer, site unspecified, unspecified as acute or chronic, without hemorrhage or perforation: Secondary | ICD-10-CM | POA: Diagnosis not present

## 2020-07-12 DIAGNOSIS — E785 Hyperlipidemia, unspecified: Secondary | ICD-10-CM | POA: Insufficient documentation

## 2020-07-12 DIAGNOSIS — Z0000 Encounter for general adult medical examination without abnormal findings: Secondary | ICD-10-CM | POA: Diagnosis not present

## 2020-07-12 DIAGNOSIS — R739 Hyperglycemia, unspecified: Secondary | ICD-10-CM

## 2020-07-12 DIAGNOSIS — E559 Vitamin D deficiency, unspecified: Secondary | ICD-10-CM | POA: Diagnosis not present

## 2020-07-12 NOTE — Progress Notes (Signed)
Subjective:  Patient ID: Zachary Burke, male    DOB: Apr 25, 1951  Age: 69 y.o. MRN: [id number]  CC: Annual Exam  This visit occurred during the SARS-CoV-2 public health emergency.  Safety protocols were in place, including screening questions prior to the visit, additional usage of staff PPE, and extensive cleaning of exam room while observing appropriate contact time as indicated for disinfecting solutions.    HPI [name] presents for a CPX.  He is very active.  He can walk up to 5 hours a day without experiencing CP DOE, palpitations, edema, or fatigue.  He developed erectile dysfunction abruptly about 4 weeks ago.  He was having sexual intercourse and was not able to stay rigid long enough to complete the job.  Since then he has noticed that the tip of his penis is curved during erections and he describes being afraid to have an erection.  He wants to see a urologist.  History Hermes has a past medical history of Basal cell carcinoma, Colon polyps, GERD (gastroesophageal reflux disease), and Hiatal hernia.   He has a past surgical history that includes Nasal septum surgery.   His family history includes Colon cancer in his paternal aunt; Colon polyps in his father and mother; Esophageal cancer in his paternal grandfather; Prostate cancer in his paternal uncle.He reports that he has never smoked. He has never used smokeless tobacco. He reports that he does not drink alcohol and does not use drugs.  Outpatient Medications Prior to Visit  Medication Sig Dispense Refill   psyllium (METAMUCIL) 58.6 % packet Take 1 packet by mouth daily.     sucralfate (CARAFATE) 1 GM/10ML suspension Take 10 mLs (1 g total) by mouth 4 (four) times daily -  with meals and at bedtime. 420 mL 0   No facility-administered medications prior to visit.    ROS Review of Systems  Constitutional: Negative.  Negative for appetite change, diaphoresis, fatigue and unexpected weight change.  HENT:  Negative.   Eyes: Negative for visual disturbance.  Respiratory: Negative for cough, chest tightness, shortness of breath and wheezing.   Cardiovascular: Negative for chest pain, palpitations and leg swelling.  Gastrointestinal: Negative for abdominal pain, constipation, diarrhea, nausea and vomiting.  Endocrine: Negative.   Genitourinary: Positive for penile pain. Negative for difficulty urinating, dysuria, hematuria, penile swelling, scrotal swelling, testicular pain and urgency.       +ED  Musculoskeletal: Negative for arthralgias and myalgias.  Skin: Negative.  Negative for color change, pallor and rash.  Neurological: Negative.  Negative for dizziness, weakness and light-headedness.  Hematological: Negative for adenopathy. Does not bruise/bleed easily.  Psychiatric/Behavioral: Negative.     Objective:  BP 136/84 (BP Location: Left Arm, Patient Position: Sitting, Cuff Size: Normal)    Pulse 76    Temp 98.5 F (36.9 C) (Oral)    Resp 16    Ht 5' 11.5" (1.816 m)    Wt 183 lb (83 kg)    SpO2 98%    BMI 25.17 kg/m   Physical Exam Vitals reviewed.  Constitutional:      Appearance: Normal appearance.  HENT:     Nose: Nose normal.     Mouth/Throat:     Mouth: Mucous membranes are moist.  Eyes:     General: No scleral icterus.    Conjunctiva/sclera: Conjunctivae normal.  Cardiovascular:     Rate and Rhythm: Normal rate and regular rhythm.     Heart sounds: No murmur heard.      Comments: EKG -  NSR, 64 bpm Normal EKG Pulmonary:     Effort: Pulmonary effort is normal.     Breath sounds: No stridor. No wheezing, rhonchi or rales.  Abdominal:     General: Abdomen is flat. Bowel sounds are normal. There is no distension.     Palpations: Abdomen is soft. There is no hepatomegaly, splenomegaly or mass.     Tenderness: There is no abdominal tenderness.     Hernia: No hernia is present.  Genitourinary:    Pubic Area: No rash.      Penis: Normal and circumcised. No tenderness,  discharge, swelling or lesions.      Testes: Normal.        Right: Mass or tenderness not present.        Left: Mass or tenderness not present.     Epididymis:     Right: Normal. Not inflamed or enlarged. No mass.     Left: Normal. Not inflamed or enlarged. No mass.     Prostate: Enlarged. Not tender and no nodules present.     Rectum: Normal. Guaiac result negative. No mass, tenderness, anal fissure, external hemorrhoid or internal hemorrhoid. Normal anal tone.  Musculoskeletal:        General: Normal range of motion.     Cervical back: Neck supple.     Right lower leg: No edema.     Left lower leg: No edema.  Lymphadenopathy:     Cervical: No cervical adenopathy.  Skin:    General: Skin is warm and dry.     Findings: No rash.  Neurological:     General: No focal deficit present.     Mental Status: He is alert and oriented to person, place, and time. Mental status is at baseline.  Psychiatric:        Mood and Affect: Mood normal.        Behavior: Behavior normal.     Lab Results  Component Value Date   WBC 5.2 07/12/2020   HGB 15.7 07/12/2020   HCT 47.4 07/12/2020   PLT 208 07/12/2020   GLUCOSE 97 07/12/2020   CHOL 170 07/12/2020   TRIG 151 (H) 07/12/2020   HDL 40 07/12/2020   LDLCALC 104 (H) 07/12/2020   ALT 13 07/12/2020   AST 16 07/12/2020   NA 138 07/12/2020   K 4.7 07/12/2020   CL 102 07/12/2020   CREATININE 1.08 07/12/2020   BUN 16 07/12/2020   CO2 28 07/12/2020   TSH 2.04 07/12/2020   PSA 1.0 07/12/2020   HGBA1C 5.5 07/12/2020    Assessment & Plan:   Dimitris was seen today for annual exam.  Diagnoses and all orders for this visit:  Routine general medical examination at a health care facility- Exam completed, labs reviewed, vaccines reviewed-he agrees to get vaccinated against influenza soon, cancer screenings are up-to-date, patient education material was given.  Erectile dysfunction due to arterial insufficiency- I do not see any physical or  metabolic causes to explain this.  He does not want to take a medication for ED.  He will be seeing urology about this. -     TSH; Future -     Testosterone Total,Free,Bio, Males; Future -     Testosterone Total,Free,Bio, Males -     TSH  Benign prostatic hyperplasia without lower urinary tract symptoms- His PSA is normal which is a reassuring sign that he does not have prostate cancer.  He has no symptoms that need to be treated. -  PSA; Future -     Urinalysis, Routine w reflex microscopic; Future -     Urinalysis, Routine w reflex microscopic -     PSA  Hyperlipidemia LDL goal <130- He has a mildly elevated ASCVD risk score.  Of asked him to take a statin for CV risk reduction. -     Lipid panel; Future -     TSH; Future -     Hepatic function panel; Future -     Hepatic function panel -     TSH -     Lipid panel -     rosuvastatin (CRESTOR) 5 MG tablet; Take 1 tablet (5 mg total) by mouth daily.  PUD (peptic ulcer disease)-he avoids NSAIDs and does not have any trouble with this. -     CBC with Differential/Platelet; Future -     CBC with Differential/Platelet  Elevated systolic blood pressure reading without diagnosis of hypertension- His labs are negative for secondary causes or endorgan damage.  His EKG is reassuring.  He is not interested in taking an antihypertensive at this time. -     CBC with Differential/Platelet; Future -     BASIC METABOLIC PANEL WITH GFR; Future -     TSH; Future -     Urinalysis, Routine w reflex microscopic; Future -     VITAMIN D 25 Hydroxy (Vit-D Deficiency, Fractures); Future -     EKG 12-Lead -     VITAMIN D 25 Hydroxy (Vit-D Deficiency, Fractures) -     Urinalysis, Routine w reflex microscopic -     TSH -     BASIC METABOLIC PANEL WITH GFR -     CBC with Differential/Platelet  Chronic hyperglycemia- His blood sugars are normal now. -     BASIC METABOLIC PANEL WITH GFR; Future -     Hemoglobin A1c; Future -     Hemoglobin A1c -      BASIC METABOLIC PANEL WITH GFR  Vitamin D deficiency disease -     Cholecalciferol 50 MCG (2000 UT) TABS; Take 1 tablet (2,000 Units total) by mouth daily.   I have discontinued Broadus John Dyke's sucralfate. I am also having him start on Cholecalciferol and rosuvastatin. Additionally, I am having him maintain his psyllium.  Meds ordered this encounter  Medications   Cholecalciferol 50 MCG (2000 UT) TABS    Sig: Take 1 tablet (2,000 Units total) by mouth daily.    Dispense:  90 tablet    Refill:  1   rosuvastatin (CRESTOR) 5 MG tablet    Sig: Take 1 tablet (5 mg total) by mouth daily.    Dispense:  90 tablet    Refill:  1     Follow-up: Return in about 6 months (around 01/12/2021).  Scarlette Calico, MD

## 2020-07-12 NOTE — Patient Instructions (Addendum)

## 2020-07-13 ENCOUNTER — Encounter: Payer: Self-pay | Admitting: Internal Medicine

## 2020-07-13 ENCOUNTER — Other Ambulatory Visit: Payer: Self-pay

## 2020-07-13 ENCOUNTER — Other Ambulatory Visit: Payer: Medicare HMO

## 2020-07-13 DIAGNOSIS — E559 Vitamin D deficiency, unspecified: Secondary | ICD-10-CM | POA: Insufficient documentation

## 2020-07-13 DIAGNOSIS — Z20822 Contact with and (suspected) exposure to covid-19: Secondary | ICD-10-CM | POA: Diagnosis not present

## 2020-07-13 DIAGNOSIS — R739 Hyperglycemia, unspecified: Secondary | ICD-10-CM | POA: Insufficient documentation

## 2020-07-13 LAB — BASIC METABOLIC PANEL WITH GFR
BUN: 16 mg/dL (ref 7–25)
CO2: 28 mmol/L (ref 20–32)
Calcium: 9.5 mg/dL (ref 8.6–10.3)
Chloride: 102 mmol/L (ref 98–110)
Creat: 1.08 mg/dL (ref 0.70–1.25)
GFR, Est African American: 81 mL/min/{1.73_m2} (ref >=60)
GFR, Est Non African American: 70 mL/min/{1.73_m2} (ref >=60)
Glucose, Bld: 97 mg/dL (ref 65–99)
Potassium: 4.7 mmol/L (ref 3.5–5.3)
Sodium: 138 mmol/L (ref 135–146)

## 2020-07-13 LAB — LIPID PANEL
Cholesterol: 170 mg/dL (ref <200)
HDL: 40 mg/dL (ref >=40)
LDL Cholesterol (Calc): 104 mg/dL (calc) — ABNORMAL HIGH
Non-HDL Cholesterol (Calc): 130 mg/dL (calc) — ABNORMAL HIGH (ref <130)
Total CHOL/HDL Ratio: 4.3 (calc) (ref <5.0)
Triglycerides: 151 mg/dL — ABNORMAL HIGH (ref <150)

## 2020-07-13 LAB — CBC WITH DIFFERENTIAL/PLATELET
Absolute Monocytes: 608 cells/uL (ref 200–950)
Basophils Absolute: 42 cells/uL (ref 0–200)
Basophils Relative: 0.8 %
Eosinophils Absolute: 151 cells/uL (ref 15–500)
Eosinophils Relative: 2.9 %
HCT: 47.4 % (ref 38.5–50.0)
Hemoglobin: 15.7 g/dL (ref 13.2–17.1)
Lymphs Abs: 1269 cells/uL (ref 850–3900)
MCH: 29.4 pg (ref 27.0–33.0)
MCHC: 33.1 g/dL (ref 32.0–36.0)
MCV: 88.8 fL (ref 80.0–100.0)
MPV: 10.8 fL (ref 7.5–12.5)
Monocytes Relative: 11.7 %
Neutro Abs: 3130 cells/uL (ref 1500–7800)
Neutrophils Relative %: 60.2 %
Platelets: 208 10*3/uL (ref 140–400)
RBC: 5.34 10*6/uL (ref 4.20–5.80)
RDW: 13.1 % (ref 11.0–15.0)
Total Lymphocyte: 24.4 %
WBC: 5.2 10*3/uL (ref 3.8–10.8)

## 2020-07-13 LAB — TESTOSTERONE TOTAL,FREE,BIO, MALES
Albumin: 4.3 g/dL (ref 3.6–5.1)
Sex Hormone Binding: 48 nmol/L (ref 22–77)
Testosterone, Bioavailable: 113.6 ng/dL (ref >=110.0)
Testosterone, Free: 57.7 pg/mL (ref 46.0–224.0)
Testosterone: 575 ng/dL (ref 250–827)

## 2020-07-13 LAB — PSA: PSA: 1 ng/mL (ref <=4.0)

## 2020-07-13 LAB — HEPATIC FUNCTION PANEL
AG Ratio: 1.6 (calc) (ref 1.0–2.5)
ALT: 13 U/L (ref 9–46)
AST: 16 U/L (ref 10–35)
Albumin: 4.3 g/dL (ref 3.6–5.1)
Alkaline phosphatase (APISO): 63 U/L (ref 35–144)
Bilirubin, Direct: 0.1 mg/dL (ref 0.0–0.2)
Globulin: 2.7 g/dL (calc) (ref 1.9–3.7)
Indirect Bilirubin: 0.4 mg/dL (calc) (ref 0.2–1.2)
Total Bilirubin: 0.5 mg/dL (ref 0.2–1.2)
Total Protein: 7 g/dL (ref 6.1–8.1)

## 2020-07-13 LAB — URINALYSIS, ROUTINE W REFLEX MICROSCOPIC
Bilirubin Urine: NEGATIVE
Glucose, UA: NEGATIVE
Hgb urine dipstick: NEGATIVE
Ketones, ur: NEGATIVE
Leukocytes,Ua: NEGATIVE
Nitrite: NEGATIVE
Protein, ur: NEGATIVE
Specific Gravity, Urine: 1.015 (ref 1.001–1.03)
pH: 6 (ref 5.0–8.0)

## 2020-07-13 LAB — VITAMIN D 25 HYDROXY (VIT D DEFICIENCY, FRACTURES): Vit D, 25-Hydroxy: 21 ng/mL — ABNORMAL LOW (ref 30–100)

## 2020-07-13 LAB — HEMOGLOBIN A1C
Hgb A1c MFr Bld: 5.5 % of total Hgb (ref <5.7)
Mean Plasma Glucose: 111 (calc)
eAG (mmol/L): 6.2 (calc)

## 2020-07-13 LAB — TSH: TSH: 2.04 mIU/L (ref 0.40–4.50)

## 2020-07-13 MED ORDER — CHOLECALCIFEROL 50 MCG (2000 UT) PO TABS: 1.0000 | ORAL_TABLET | Freq: Every day | ORAL | 1 refills | Status: DC

## 2020-07-13 MED ORDER — ROSUVASTATIN CALCIUM 5 MG PO TABS: 5.0000 mg | ORAL_TABLET | Freq: Every day | ORAL | 1 refills | Status: DC

## 2020-07-14 LAB — NOVEL CORONAVIRUS, NAA: SARS-CoV-2, NAA: NOT DETECTED

## 2020-07-14 LAB — SARS-COV-2, NAA 2 DAY TAT

## 2020-07-20 ENCOUNTER — Other Ambulatory Visit: Payer: Self-pay

## 2020-08-01 DIAGNOSIS — N486 Induration penis plastica: Secondary | ICD-10-CM | POA: Diagnosis not present

## 2020-08-10 ENCOUNTER — Encounter: Payer: Self-pay | Admitting: Internal Medicine

## 2020-08-30 ENCOUNTER — Encounter: Payer: Self-pay | Admitting: Internal Medicine

## 2020-09-29 DIAGNOSIS — C4442 Squamous cell carcinoma of skin of scalp and neck: Secondary | ICD-10-CM | POA: Diagnosis not present

## 2020-09-29 DIAGNOSIS — L570 Actinic keratosis: Secondary | ICD-10-CM | POA: Diagnosis not present

## 2020-09-29 DIAGNOSIS — D1801 Hemangioma of skin and subcutaneous tissue: Secondary | ICD-10-CM | POA: Diagnosis not present

## 2020-09-29 DIAGNOSIS — Z85828 Personal history of other malignant neoplasm of skin: Secondary | ICD-10-CM | POA: Diagnosis not present

## 2020-09-29 DIAGNOSIS — D485 Neoplasm of uncertain behavior of skin: Secondary | ICD-10-CM | POA: Diagnosis not present

## 2020-10-12 DIAGNOSIS — R69 Illness, unspecified: Secondary | ICD-10-CM | POA: Diagnosis not present

## 2020-10-19 DIAGNOSIS — Z20822 Contact with and (suspected) exposure to covid-19: Secondary | ICD-10-CM | POA: Diagnosis not present

## 2020-10-25 ENCOUNTER — Encounter: Payer: Self-pay | Admitting: Internal Medicine

## 2020-10-25 ENCOUNTER — Other Ambulatory Visit: Payer: Self-pay | Admitting: Internal Medicine

## 2020-10-25 DIAGNOSIS — J0100 Acute maxillary sinusitis, unspecified: Secondary | ICD-10-CM

## 2020-10-25 MED ORDER — AMOXICILLIN-POT CLAVULANATE 600-42.9 MG/5ML PO SUSR: 600.0000 mg | Freq: Three times a day (TID) | ORAL | 0 refills | Status: DC

## 2020-12-07 DIAGNOSIS — L570 Actinic keratosis: Secondary | ICD-10-CM | POA: Diagnosis not present

## 2020-12-07 DIAGNOSIS — L820 Inflamed seborrheic keratosis: Secondary | ICD-10-CM | POA: Diagnosis not present

## 2021-01-02 ENCOUNTER — Encounter: Payer: Self-pay | Admitting: Internal Medicine

## 2021-01-04 ENCOUNTER — Telehealth (INDEPENDENT_AMBULATORY_CARE_PROVIDER_SITE_OTHER): Payer: Medicare HMO | Admitting: Family

## 2021-01-04 DIAGNOSIS — J019 Acute sinusitis, unspecified: Secondary | ICD-10-CM | POA: Diagnosis not present

## 2021-01-04 MED ORDER — DOXYCYCLINE HYCLATE 100 MG PO TABS: 100.0000 mg | ORAL_TABLET | Freq: Two times a day (BID) | ORAL | 0 refills | Status: AC

## 2021-01-04 NOTE — Progress Notes (Signed)
Zachary Burke is a 70 y.o. male with the following history as recorded in EpicCare:  Patient Active Problem List   Diagnosis Date Noted  . Acute non-recurrent maxillary sinusitis 10/25/2020  . Vitamin D deficiency disease 07/13/2020  . Chronic hyperglycemia 07/13/2020  . Erectile dysfunction due to arterial insufficiency 07/12/2020  . Routine general medical examination at a health care facility 07/12/2020  . Benign prostatic hyperplasia without lower urinary tract symptoms 07/12/2020  . Hyperlipidemia LDL goal <130 07/12/2020  . PUD (peptic ulcer disease) 07/12/2020  . Elevated systolic blood pressure reading without diagnosis of hypertension 07/12/2020  . Esophageal reflux 08/21/2010  . Personal history of colonic polyps 08/21/2010    Current Outpatient Medications  Medication Sig Dispense Refill  . Cholecalciferol 50 MCG (2000 UT) TABS Take 1 tablet (2,000 Units total) by mouth daily. 90 tablet 1  . doxycycline (VIBRA-TABS) 100 MG tablet Take 1 tablet (100 mg total) by mouth 2 (two) times daily for 10 days. 20 tablet 0  . psyllium (METAMUCIL) 58.6 % packet Take 1 packet by mouth daily.     No current facility-administered medications for this visit.    Allergies: Patient has no known allergies.  Past Medical History:  Diagnosis Date  . Basal cell carcinoma   . Colon polyps    2002,2006  . GERD (gastroesophageal reflux disease)   . Hiatal hernia     Past Surgical History:  Procedure Laterality Date  . NASAL SEPTUM SURGERY      Family History  Problem Relation Age of Onset  . Prostate cancer Paternal Uncle   . Esophageal cancer Paternal Grandfather   . Colon polyps Mother   . Colon polyps Father   . Colon cancer Paternal Aunt     Social History   Tobacco Use  . Smoking status: Never Smoker  . Smokeless tobacco: Never Used  Substance Use Topics  . Alcohol use: No    Subjective:   I connected with Ruben Gottron on 01/04/21 at 11:00 AM EST by a video enabled  telemedicine application and verified that I am speaking with the correct person using two identifiers.   I discussed the limitations of evaluation and management by telemedicine and the availability of in person appointments. The patient expressed understanding and agreed to proceed. Provider in office/ patient is at home; provider and patient are only 2 people on video call.   Concerned for sinus infection; symptoms x 10 days; keeping 20 yo granddaughter and suspicious that he caught cold from her; feels like congestion moving into chest; using OTC Flonase with some relief; no chest pain or difficulty breathing; took negative COVID test last Thursday; treated for similar symptoms with Augmentin in December; felt like that infection cleared completely;     Objective:  There were no vitals filed for this visit.  General: Well developed, well nourished, in no acute distress  Head: Normocephalic and atraumatic  Lungs: Respirations unlabored;  Neurologic: Alert and oriented; speech intact; face symmetrical;   Assessment:  1. Acute sinusitis, recurrence not specified, unspecified location     Plan:  Rx for Doxycycline 100 mg bid x 10 days; continue Flonase; to consider CXR if symptoms persist; increase fluids, rest and follow-up worse, no better.   No follow-ups on file.  No orders of the defined types were placed in this encounter.   Requested Prescriptions   Signed Prescriptions Disp Refills  . doxycycline (VIBRA-TABS) 100 MG tablet 20 tablet 0    Sig: Take 1 tablet (100  mg total) by mouth 2 (two) times daily for 10 days.

## 2021-01-05 DIAGNOSIS — D0439 Carcinoma in situ of skin of other parts of face: Secondary | ICD-10-CM | POA: Diagnosis not present

## 2021-01-05 DIAGNOSIS — D0421 Carcinoma in situ of skin of right ear and external auricular canal: Secondary | ICD-10-CM | POA: Diagnosis not present

## 2021-01-05 DIAGNOSIS — Z85828 Personal history of other malignant neoplasm of skin: Secondary | ICD-10-CM | POA: Diagnosis not present

## 2021-01-05 DIAGNOSIS — L820 Inflamed seborrheic keratosis: Secondary | ICD-10-CM | POA: Diagnosis not present

## 2021-01-05 DIAGNOSIS — D485 Neoplasm of uncertain behavior of skin: Secondary | ICD-10-CM | POA: Diagnosis not present

## 2021-01-05 DIAGNOSIS — D1801 Hemangioma of skin and subcutaneous tissue: Secondary | ICD-10-CM | POA: Diagnosis not present

## 2021-01-05 DIAGNOSIS — L570 Actinic keratosis: Secondary | ICD-10-CM | POA: Diagnosis not present

## 2021-01-05 DIAGNOSIS — L821 Other seborrheic keratosis: Secondary | ICD-10-CM | POA: Diagnosis not present

## 2021-01-31 ENCOUNTER — Encounter: Payer: Self-pay | Admitting: Internal Medicine

## 2021-01-31 ENCOUNTER — Other Ambulatory Visit: Payer: Self-pay | Admitting: Internal Medicine

## 2021-01-31 DIAGNOSIS — G8929 Other chronic pain: Secondary | ICD-10-CM | POA: Insufficient documentation

## 2021-01-31 DIAGNOSIS — M545 Low back pain, unspecified: Secondary | ICD-10-CM | POA: Insufficient documentation

## 2021-02-01 ENCOUNTER — Encounter: Payer: Self-pay | Admitting: Internal Medicine

## 2021-02-01 ENCOUNTER — Other Ambulatory Visit: Payer: Self-pay | Admitting: Internal Medicine

## 2021-02-01 DIAGNOSIS — M545 Low back pain, unspecified: Secondary | ICD-10-CM

## 2021-02-01 DIAGNOSIS — G8929 Other chronic pain: Secondary | ICD-10-CM

## 2021-02-01 MED ORDER — TIZANIDINE HCL 2 MG PO TABS: 2.0000 mg | ORAL_TABLET | Freq: Three times a day (TID) | ORAL | 0 refills | Status: DC | PRN

## 2021-02-06 ENCOUNTER — Other Ambulatory Visit: Payer: Self-pay

## 2021-02-06 ENCOUNTER — Ambulatory Visit (INDEPENDENT_AMBULATORY_CARE_PROVIDER_SITE_OTHER): Payer: Medicare HMO | Admitting: Family Medicine

## 2021-02-06 VITALS — BP 121/74 | HR 67 | Ht 71.5 in | Wt 177.6 lb

## 2021-02-06 DIAGNOSIS — G8929 Other chronic pain: Secondary | ICD-10-CM

## 2021-02-06 DIAGNOSIS — M545 Low back pain, unspecified: Secondary | ICD-10-CM

## 2021-02-06 NOTE — Patient Instructions (Addendum)
Thank you for coming in today.  I've referred you to Physical Therapy.  Let us know if you don't hear from them in one week.  Recheck with me in 6 weeks if not better.    TENS UNIT: This is helpful for muscle pain and spasm.   Search and Purchase a TENS 7000 2nd edition at  www.tenspros.com or www.[employer].com It should be less than $30.     TENS unit instructions: Do not shower or bathe with the unit on . Turn the unit off before removing electrodes or batteries . If the electrodes lose stickiness add a drop of water to the electrodes after they are disconnected from the unit and place on plastic sheet. If you continued to have difficulty, call the TENS unit company to purchase more electrodes. . Do not apply lotion on the skin area prior to use. Make sure the skin is clean and dry as this will help prolong the life of the electrodes. . After use, always check skin for unusual red areas, rash or other skin difficulties. If there are any skin problems, does not apply electrodes to the same area. . Never remove the electrodes from the unit by pulling the wires. . Do not use the TENS unit or electrodes other than as directed. . Do not change electrode placement without consultating your therapist or physician. Marland Kitchen Keep 2 fingers with between each electrode. . Wear time ratio is 2:1, on to off times.    For example on for 30 minutes off for 15 minutes and then on for 30 minutes off for 15 minutes

## 2021-02-06 NOTE — Progress Notes (Signed)
Subjective:    CC: Low back pain  I, Molly Weber, LAT, ATC, am serving as scribe for Dr. Lynne Leader.  HPI: Pt is a 70 y/o male presenting w/ acute-on-chronic low back pain that flared up after resuming swimming for exercise that originally started 4-5 years ago. MOI: Pt was walking dog who took off after a rabbit and "jerked" him, causing a pop in his back. He locates his pain to bilat low back w/ ecchymosis proximal to gluteal crease.  He has a hx of LBP and has previously been diagnosed w/ a bulging disc in 2019. Pt is very active; walking 2-3 hours a week, swimming, doing wood work, turning bowls, forestry, Social research officer, government. Pt is curious to know if his pain is muscular and what is causing the bruising on his back.   Radiating pain: radiating into t-spine LE numbness/tingling: no LE weakness: no Aggravating factors: pushing pull when doing woodworking. Treatments tried: PT, HEP, muscle relaxer, salve, Tylenol  Diagnostic testing: L-spine MRI- 11/09/18  Pertinent review of Systems: No fevers or chills  Relevant historical information: Hyperlipidemia   Objective:    Vitals:   02/06/21 1455  BP: 121/74  Pulse: 67  SpO2: 98%   General: Well Developed, well nourished, and in no acute distress.   MSK: L-spine normal-appearing no large bruise today. Decreased lumbar motion. Tender palpation with rigid paraspinal musculature right paraspinal muscle. Lower extremity strength reflexes and sensation are equal and normal throughout.  Lab and Radiology Results  EXAM: MRI LUMBAR SPINE WITHOUT CONTRAST  TECHNIQUE: Multiplanar, multisequence MR imaging of the lumbar spine was performed. No intravenous contrast was administered.  COMPARISON:  None.  FINDINGS: Segmentation:  Normal  Alignment:  Slight retrolisthesis L3-4. Mild anterolisthesis L4-5  Vertebrae:  Negative for fracture or mass.  Conus medullaris and cauda equina: Conus extends to the L1-2 level. Conus and  cauda equina appear normal.  Paraspinal and other soft tissues: Negative for paraspinous mass or adenopathy  Disc levels:  L1-2: Negative  L2-3: Normal disc space.  Mild facet degeneration without stenosis  L3-4: Disc degeneration with disc bulging and endplate spurring. Moderate facet hypertrophy bilaterally. Moderate spinal stenosis. Moderate subarticular and foraminal stenosis bilaterally.  L4-5: Mild anterolisthesis. Small left-sided disc protrusion with subarticular stenosis and possible impingement of the left L5 nerve root. Moderate to advanced facet hypertrophy bilaterally with mild spinal stenosis. Moderate foraminal stenosis bilaterally  L5-S1: Shallow central disc protrusion without significant neural impingement  IMPRESSION: Moderate spinal stenosis L3-4 with moderate subarticular and foraminal stenosis bilaterally  Small left-sided disc protrusion L4-5 with possible impingement left L5 nerve root. Mild spinal stenosis and moderate foraminal stenosis bilaterally  Small central disc protrusion L5-S1 without neural impingement.   Electronically Signed   By: Franchot Gallo M.D.   On: 11/09/2018 09:50  I, Lynne Leader, personally (independently) visualized and performed the interpretation of the images attached in this note.    Impression and Recommendations:    Assessment and Plan: 70 y.o. male with right low back pain due to paraspinal muscle dysfunction and spasm.  Plan for physical therapy and home exercise program.  Recommend also heat and TENS unit.  Recheck back as needed.  PDMP not reviewed this encounter. Orders Placed This Encounter  Procedures  . DG Lumbar Spine 2-3 Views    Standing Status:   Future    Standing Expiration Date:   02/06/2022    Order Specific Question:   Reason for Exam (SYMPTOM  OR DIAGNOSIS REQUIRED)  Answer:   low back pain    Order Specific Question:   Preferred imaging location?    Answer:   [facility]  . Ambulatory referral to Physical Therapy    Referral Priority:   Routine    Referral Type:   Physical Medicine    Referral Reason:   Specialty Services Required    Requested Specialty:   Physical Therapy   No orders of the defined types were placed in this encounter.   Discussed warning signs or symptoms. Please see discharge instructions. Patient expresses understanding.   The above documentation has been reviewed and is accurate and complete Lynne Leader, M.D.

## 2021-02-16 ENCOUNTER — Other Ambulatory Visit: Payer: Self-pay

## 2021-02-16 ENCOUNTER — Encounter (HOSPITAL_BASED_OUTPATIENT_CLINIC_OR_DEPARTMENT_OTHER): Payer: Self-pay | Admitting: Physical Therapy

## 2021-02-16 ENCOUNTER — Ambulatory Visit (HOSPITAL_BASED_OUTPATIENT_CLINIC_OR_DEPARTMENT_OTHER): Payer: Medicare HMO | Attending: Family Medicine | Admitting: Physical Therapy

## 2021-02-16 DIAGNOSIS — M6281 Muscle weakness (generalized): Secondary | ICD-10-CM | POA: Insufficient documentation

## 2021-02-16 DIAGNOSIS — G8929 Other chronic pain: Secondary | ICD-10-CM | POA: Diagnosis not present

## 2021-02-16 DIAGNOSIS — M545 Low back pain, unspecified: Secondary | ICD-10-CM | POA: Insufficient documentation

## 2021-02-16 NOTE — Therapy (Signed)
[facility] [address] [place], [place], [address] Phone: [phone]   Fax:  [phone]  Physical Therapy Evaluation  Patient Details  Name: Zachary Burke MRN: [id number] Date of Birth: 04/27/1951 Referring Provider (PT): Lynne Leader, MD   Encounter Date: 02/16/2021   PT End of Session - 02/16/21 0950    Visit Number 1    Number of Visits 9    Date for PT Re-Evaluation 03/17/21    Authorization Type aetna MCR    Progress Note Due on Visit 10    PT Start Time 0932    PT Stop Time 1015    PT Time Calculation (min) 43 min    Activity Tolerance Patient tolerated treatment well    Behavior During Therapy [facility] for tasks assessed/performed           Past Medical History:  Diagnosis Date  . Basal cell carcinoma   . Colon polyps    2002,2006  . GERD (gastroesophageal reflux disease)   . Hiatal hernia     Past Surgical History:  Procedure Laterality Date  . NASAL SEPTUM SURGERY      There were no vitals filed for this visit.    Subjective Assessment - 02/16/21 0935    Subjective It was more discomfort than pain. Drove to [place] and back which did not help. My wife and I try to walk 1 mile/day and can now get around the block. every night I do PT for my shoulders and spine. Swimming for 20 min is really helpful. When I had an incident I got a black and blue spot at the bottom of my back. all started 4 years ago when a dog pulled on the leash. 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    Patient Stated Goals walk, swim    Currently in Pain? Yes    Pain Score 0-No pain    Pain Location Back    Pain Orientation Lower    Pain Descriptors / Indicators Discomfort    Aggravating Factors  moving arms aggrivates back, resistance of tool when hollowing bowls, sneezing    Pain Relieving Factors hip/lumbar flexion              [facility] PT Assessment - 02/16/21 0001      Assessment   Medical Diagnosis LBP     Referring Provider (PT) Lynne Leader, MD    Onset Date/Surgical Date --   4 years ago   Hand Dominance Right    Prior Therapy not for low back      Precautions   Precautions None      Restrictions   Weight Bearing Restrictions No      Balance Screen   Has the patient fallen in the past 6 months No      Home Environment   Additional Comments lives with wife      Prior Function   Level of Independence Independent    [employer] rights manager, foresting      Cognition   Overall Cognitive Status Within Functional Limits for tasks assessed      Observation/Other Assessments   Focus on Therapeutic Outcomes (FOTO)  not set up      Sensation   Additional Comments [facility]      Posture/Postural Control   Posture Comments decreased spinal curves, bil scpaular winging      ROM / Strength   AROM / PROM / Strength Strength      Strength  Overall Strength Comments lacking core stability in functional UE movement      Flexibility   Soft Tissue Assessment /Muscle Length yes    Hamstrings limited bilaterally      Palpation   Palpation comment incr tightness Lt paraspinals v Rt, tightness bil piriformis                      Objective measurements completed on examination: See above findings.       [facility] Adult PT Treatment/Exercise - 02/16/21 0001      Manual Therapy   Manual Therapy Soft tissue mobilization    Manual therapy comments skilled palpation and monitoring during TPDN    Soft tissue mobilization Lt lumbar paraspinals, bil piriformis            Trigger Point Dry Needling - 02/16/21 0001    Consent Given? Yes    Education Handout Provided --   verbal education   Muscles Treated Back/Hip Piriformis;Lumbar multifidi    Other Dry Needling Ltlumbarparaspinals,bilpirverbaleducation    Piriformis Response Twitch response elicited;Palpable increased muscle length   bil   Lumbar multifidi Response Twitch response elicited;Palpable increased muscle length   left                PT Education - 02/16/21 1035    Education Details anatomy of condition, POC, HEP, exercise form/rationale, TPDN & expected outcomes    Person(s) Educated Patient    Methods Explanation;Demonstration;Verbal cues;Handout    Comprehension Verbalized understanding;Returned demonstration;Verbal cues required;Need further instruction               PT Long Term Goals - 02/16/21 1028      PT LONG TERM GOAL #1   Title pt will be able to return to walking program without limitations by back pain    Baseline only going around the block right now    Time 4    Period Weeks    Status New    Target Date 03/17/21      PT LONG TERM GOAL #2   Title able to demo core control for lumbopelvic stability in resisted UE motions    Baseline notices that UE movements aggrivate back    Time 4    Period Weeks    Status New    Target Date 03/17/21      PT LONG TERM GOAL #3   Title independent with long term core stability training and stretching program    Baseline will progress and establish as appropriate    Time 4    Period Weeks    Status New    Target Date 03/17/21                  Plan - 02/16/21 1023    Clinical Impression Statement Pt presents to PT with complaints of chronic low back discomfort that began following a pull from a dog leash. He is very active but presents with s/s of poor core control when utilizing upper body. Pt requested DN today which was performed to Lt lumbar paraspinals and bil piriformis and well tolerated. He will go swimming today and perform stretches. Will cont DN PRN. Wife has taught aquatic exercise classes and he enjoys being in the pool.    Personal Factors and Comorbidities Time since onset of injury/illness/exacerbation    Examination-Activity Limitations Stand;Reach Overhead    Examination-Participation Restrictions Community Activity;Occupation    Stability/Clinical Decision Making Stable/Uncomplicated    Clinical Decision  Making Low  Rehab Potential Good    PT Frequency 2x / week    PT Duration 4 weeks    PT Treatment/Interventions ADLs/Self Care Home Management;Aquatic Therapy;Cryotherapy;Ultrasound;Traction;Moist Heat;Iontophoresis 4mg /ml Dexamethasone;Electrical Stimulation;Gait training;Stair training;Functional mobility training;Neuromuscular re-education;Therapeutic exercise;Therapeutic activities;Patient/family education;Manual techniques;Passive range of motion;Dry needling;Taping;Spinal Manipulations;Joint Manipulations    PT Next Visit Plan outcome of DN & cont PRN, core stability    PT Home Exercise Plan [email or web address]    Consulted and Agree with Plan of Care Patient           Patient will benefit from skilled therapeutic intervention in order to improve the following deficits and impairments:  Increased muscle spasms,Improper body mechanics,Decreased activity tolerance,Decreased strength,Impaired flexibility,Postural dysfunction,Pain,Difficulty walking  Visit Diagnosis: Chronic bilateral low back pain without sciatica - Plan: PT plan of care cert/re-cert  Muscle weakness (generalized) - Plan: PT plan of care cert/re-cert     Problem List Patient Active Problem List   Diagnosis Date Noted  . Chronic bilateral low back pain without sciatica 01/31/2021  . Acute non-recurrent maxillary sinusitis 10/25/2020  . Vitamin D deficiency disease 07/13/2020  . Chronic hyperglycemia 07/13/2020  . Erectile dysfunction due to arterial insufficiency 07/12/2020  . Routine general medical examination at a health care facility 07/12/2020  . Benign prostatic hyperplasia without lower urinary tract symptoms 07/12/2020  . Hyperlipidemia LDL goal <130 07/12/2020  . PUD (peptic ulcer disease) 07/12/2020  . Elevated systolic blood pressure reading without diagnosis of hypertension 07/12/2020  . Esophageal reflux 08/21/2010  . Personal history of colonic polyps 08/21/2010    Markes Shatswell C. Eliora Nienhuis PT, DPT 02/16/21  10:37 AM   [facility] [place], [place], [address] Phone: [phone]   Fax:  [phone]  Name: Chandlor Noecker MRN: [id number] Date of Birth: [birth year]

## 2021-02-20 ENCOUNTER — Ambulatory Visit (HOSPITAL_BASED_OUTPATIENT_CLINIC_OR_DEPARTMENT_OTHER): Payer: Medicare HMO | Attending: Family Medicine | Admitting: Physical Therapy

## 2021-02-20 ENCOUNTER — Encounter (HOSPITAL_BASED_OUTPATIENT_CLINIC_OR_DEPARTMENT_OTHER): Payer: Self-pay | Admitting: Physical Therapy

## 2021-02-20 ENCOUNTER — Other Ambulatory Visit: Payer: Self-pay

## 2021-02-20 DIAGNOSIS — M545 Low back pain, unspecified: Secondary | ICD-10-CM | POA: Insufficient documentation

## 2021-02-20 DIAGNOSIS — M6281 Muscle weakness (generalized): Secondary | ICD-10-CM | POA: Diagnosis not present

## 2021-02-20 DIAGNOSIS — G8929 Other chronic pain: Secondary | ICD-10-CM | POA: Diagnosis not present

## 2021-02-20 NOTE — Therapy (Signed)
[facility] [address] [place], [place], [address] Phone: [phone]   Fax:  [phone]  Physical Therapy Treatment  Patient Details  Name: Zachary Burke MRN: [id number] Date of Birth: 1951/09/29 Referring Provider (PT): Lynne Leader, MD   Encounter Date: 02/20/2021   PT End of Session - 02/20/21 0932    Visit Number 2    Number of Visits 9    Date for PT Re-Evaluation 03/17/21    Authorization Type aetna MCR    Progress Note Due on Visit 10    PT Start Time 0930    PT Stop Time 1012    PT Time Calculation (min) 42 min    Activity Tolerance Patient tolerated treatment well    Behavior During Therapy [facility] for tasks assessed/performed           Past Medical History:  Diagnosis Date  . Basal cell carcinoma   . Colon polyps    2002,2006  . GERD (gastroesophageal reflux disease)   . Hiatal hernia     Past Surgical History:  Procedure Laterality Date  . NASAL SEPTUM SURGERY      There were no vitals filed for this visit.   Subjective Assessment - 02/20/21 0931    Subjective still just a little discomfort at bil SIJ. son and daughter in law are in hospital having a baby right now    Patient Stated Goals walk, swim                             [facility] Adult PT Treatment/Exercise - 02/20/21 0001      Exercises   Exercises Lumbar      Lumbar Exercises: Stretches   Passive Hamstring Stretch Limitations supine with strap- mid & lateral bias    Lower Trunk Rotation Limitations holding 3 deep breaths      Lumbar Exercises: Standing   Other Standing Lumbar Exercises palloff press orange tband    Other Standing Lumbar Exercises lunge resisted punch orange tband      Lumbar Exercises: Seated   Other Seated Lumbar Exercises seated physioball press    Other Seated Lumbar Exercises seated physioball OH lift with core & trunk rotations      Lumbar Exercises: Supine   AB Set Limitations use of  physioball and small ball for coordinated movement    Bridge Limitations ball bw knees, tactile cues for core      Lumbar Exercises: Quadruped   Other Quadruped Lumbar Exercises hip ext in qped & with elbows on physioball    Other Quadruped Lumbar Exercises fire hydrant                       PT Long Term Goals - 02/16/21 1028      PT LONG TERM GOAL #1   Title pt will be able to return to walking program without limitations by back pain    Baseline only going around the block right now    Time 4    Period Weeks    Status New    Target Date 03/17/21      PT LONG TERM GOAL #2   Title able to demo core control for lumbopelvic stability in resisted UE motions    Baseline notices that UE movements aggrivate back    Time 4    Period Weeks    Status New    Target Date 03/17/21  PT LONG TERM GOAL #3   Title independent with long term core stability training and stretching program    Baseline will progress and establish as appropriate    Time 4    Period Weeks    Status New    Target Date 03/17/21                 Plan - 02/20/21 1322    Clinical Impression Statement Began session with coordinated contraction/isolation of abdominals which was difficult. Multiple positions utilized and pt was able to recognize contraction. He did progress very quickly to standing core challenges with cues provided for form. Denied pain following treatment.    PT Treatment/Interventions ADLs/Self Care Home Management;Aquatic Therapy;Cryotherapy;Ultrasound;Traction;Moist Heat;Iontophoresis 4mg /ml Dexamethasone;Electrical Stimulation;Gait training;Stair training;Functional mobility training;Neuromuscular re-education;Therapeutic exercise;Therapeutic activities;Patient/family education;Manual techniques;Passive range of motion;Dry needling;Taping;Spinal Manipulations;Joint Manipulations    PT Next Visit Plan DN PRN, lifting/rotation core stability    PT Home Exercise Plan [email or web address]     Consulted and Agree with Plan of Care Patient           Patient will benefit from skilled therapeutic intervention in order to improve the following deficits and impairments:  Increased muscle spasms,Improper body mechanics,Decreased activity tolerance,Decreased strength,Impaired flexibility,Postural dysfunction,Pain,Difficulty walking  Visit Diagnosis: Chronic bilateral low back pain without sciatica  Muscle weakness (generalized)     Problem List Patient Active Problem List   Diagnosis Date Noted  . Chronic bilateral low back pain without sciatica 01/31/2021  . Acute non-recurrent maxillary sinusitis 10/25/2020  . Vitamin D deficiency disease 07/13/2020  . Chronic hyperglycemia 07/13/2020  . Erectile dysfunction due to arterial insufficiency 07/12/2020  . Routine general medical examination at a health care facility 07/12/2020  . Benign prostatic hyperplasia without lower urinary tract symptoms 07/12/2020  . Hyperlipidemia LDL goal <130 07/12/2020  . PUD (peptic ulcer disease) 07/12/2020  . Elevated systolic blood pressure reading without diagnosis of hypertension 07/12/2020  . Esophageal reflux 08/21/2010  . Personal history of colonic polyps 08/21/2010   Erilyn Pearman C. Sammuel Blick PT, DPT 02/20/21 1:26 PM   [facility] [address] [place], [place], [address] Phone: [phone]   Fax:  [phone]  Name: Math Brazie MRN: [id number] Date of Birth: 1951-09-24

## 2021-02-22 ENCOUNTER — Ambulatory Visit (HOSPITAL_BASED_OUTPATIENT_CLINIC_OR_DEPARTMENT_OTHER): Payer: Medicare HMO | Admitting: Physical Therapy

## 2021-02-22 ENCOUNTER — Encounter (HOSPITAL_BASED_OUTPATIENT_CLINIC_OR_DEPARTMENT_OTHER): Payer: Self-pay | Admitting: Physical Therapy

## 2021-02-22 ENCOUNTER — Other Ambulatory Visit: Payer: Self-pay

## 2021-02-22 DIAGNOSIS — G8929 Other chronic pain: Secondary | ICD-10-CM | POA: Diagnosis not present

## 2021-02-22 DIAGNOSIS — M6281 Muscle weakness (generalized): Secondary | ICD-10-CM | POA: Diagnosis not present

## 2021-02-22 DIAGNOSIS — M545 Low back pain, unspecified: Secondary | ICD-10-CM

## 2021-02-22 NOTE — Therapy (Signed)
[facility] [address] [place], [place], [address] Phone: [phone]   Fax:  [phone]  Physical Therapy Treatment  Patient Details  Name: Zachary Burke MRN: [id number] Date of Birth: 02-Mar-1951 Referring Provider (PT): Lynne Leader, MD   Encounter Date: 02/22/2021   PT End of Session - 02/22/21 0931    Visit Number 3    Number of Visits 9    Date for PT Re-Evaluation 03/17/21    Authorization Type aetna MCR    PT Start Time 0930    PT Stop Time 1009    PT Time Calculation (min) 39 min    Activity Tolerance Patient tolerated treatment well    Behavior During Therapy [facility] for tasks assessed/performed           Past Medical History:  Diagnosis Date  . Basal cell carcinoma   . Colon polyps    2002,2006  . GERD (gastroesophageal reflux disease)   . Hiatal hernia     Past Surgical History:  Procedure Laterality Date  . NASAL SEPTUM SURGERY      There were no vitals filed for this visit.   Subjective Assessment - 02/22/21 0932    Subjective No pain still, mild discomfort in Lt SIJ.                             [facility] Adult PT Treatment/Exercise - 02/22/21 0001      Lumbar Exercises: Stretches   Other Lumbar Stretch Exercise LTR ball bw knees    Other Lumbar Stretch Exercise child pose- multiple through session      Lumbar Exercises: Standing   Other Standing Lumbar Exercises squats to tap table      Lumbar Exercises: Seated   Other Seated Lumbar Exercises kneeling chops 2lb    Other Seated Lumbar Exercises hihg kneeling punch & lean backs 2lb      Lumbar Exercises: Supine   AB Set Limitations pelvic tilt with small ball    Bridge Limitations ball bw knees      Lumbar Exercises: Quadruped   Other Quadruped Lumbar Exercises hip ext, bird dog      Manual Therapy   Soft tissue mobilization roller Lt hip                       PT Long Term Goals - 02/16/21 1028      PT  LONG TERM GOAL #1   Title pt will be able to return to walking program without limitations by back pain    Baseline only going around the block right now    Time 4    Period Weeks    Status New    Target Date 03/17/21      PT LONG TERM GOAL #2   Title able to demo core control for lumbopelvic stability in resisted UE motions    Baseline notices that UE movements aggrivate back    Time 4    Period Weeks    Status New    Target Date 03/17/21      PT LONG TERM GOAL #3   Title independent with long term core stability training and stretching program    Baseline will progress and establish as appropriate    Time 4    Period Weeks    Status New    Target Date 03/17/21  Plan - 02/22/21 1016    Clinical Impression Statement Was still feeling some discomfort in Lt hip which was found to be tight piriformis and glut med/min, reduced with STM. Continued to progress core activation in various positions which were well tolerated. Will be in on Monday and then traveling to [facility] to meet new grand baby.    PT Treatment/Interventions ADLs/Self Care Home Management;Aquatic Therapy;Cryotherapy;Ultrasound;Traction;Moist Heat;Iontophoresis 4mg /ml Dexamethasone;Electrical Stimulation;Gait training;Stair training;Functional mobility training;Neuromuscular re-education;Therapeutic exercise;Therapeutic activities;Patient/family education;Manual techniques;Passive range of motion;Dry needling;Taping;Spinal Manipulations;Joint Manipulations    PT Next Visit Plan finalize HEP prior to going to [place] and Agree with Plan of Care Patient           Patient will benefit from skilled therapeutic intervention in order to improve the following deficits and impairments:  Increased muscle spasms,Improper body mechanics,Decreased activity tolerance,Decreased strength,Impaired flexibility,Postural dysfunction,Pain,Difficulty walking  Visit  Diagnosis: Chronic bilateral low back pain without sciatica  Muscle weakness (generalized)     Problem List Patient Active Problem List   Diagnosis Date Noted  . Chronic bilateral low back pain without sciatica 01/31/2021  . Acute non-recurrent maxillary sinusitis 10/25/2020  . Vitamin D deficiency disease 07/13/2020  . Chronic hyperglycemia 07/13/2020  . Erectile dysfunction due to arterial insufficiency 07/12/2020  . Routine general medical examination at a health care facility 07/12/2020  . Benign prostatic hyperplasia without lower urinary tract symptoms 07/12/2020  . Hyperlipidemia LDL goal <130 07/12/2020  . PUD (peptic ulcer disease) 07/12/2020  . Elevated systolic blood pressure reading without diagnosis of hypertension 07/12/2020  . Esophageal reflux 08/21/2010  . Personal history of colonic polyps 08/21/2010    Shavar Gorka C. Fardowsa Authier PT, DPT 02/22/21 10:19 AM   [facility] [place], [place], [address] Phone: [phone]   Fax:  [phone]  Name: Zachary Burke MRN: [id number] Date of Birth: February 29, 1951

## 2021-02-27 ENCOUNTER — Ambulatory Visit (HOSPITAL_BASED_OUTPATIENT_CLINIC_OR_DEPARTMENT_OTHER): Payer: Medicare HMO | Admitting: Physical Therapy

## 2021-02-27 ENCOUNTER — Encounter (HOSPITAL_BASED_OUTPATIENT_CLINIC_OR_DEPARTMENT_OTHER): Payer: Self-pay | Admitting: Physical Therapy

## 2021-02-27 ENCOUNTER — Other Ambulatory Visit: Payer: Self-pay

## 2021-02-27 DIAGNOSIS — M545 Low back pain, unspecified: Secondary | ICD-10-CM

## 2021-02-27 DIAGNOSIS — M6281 Muscle weakness (generalized): Secondary | ICD-10-CM | POA: Diagnosis not present

## 2021-02-27 DIAGNOSIS — G8929 Other chronic pain: Secondary | ICD-10-CM | POA: Diagnosis not present

## 2021-02-27 NOTE — Therapy (Signed)
[facility] [address] [place], [place], [address] Phone: [phone]   Fax:  [phone]  Physical Therapy Treatment  Patient Details  Name: Zachary Burke MRN: [id number] Date of Birth: [birth year] Referring Provider (PT): Lynne Leader, MD   Encounter Date: 02/27/2021   PT End of Session - 02/27/21 0932    Visit Number 4    Number of Visits 9    Date for PT Re-Evaluation 03/17/21    Authorization Type aetna MCR    PT Start Time 0930    PT Stop Time 1013    PT Time Calculation (min) 43 min    Activity Tolerance Patient tolerated treatment well    Behavior During Therapy [facility] for tasks assessed/performed           Past Medical History:  Diagnosis Date  . Basal cell carcinoma   . Colon polyps    2002,2006  . GERD (gastroesophageal reflux disease)   . Hiatal hernia     Past Surgical History:  Procedure Laterality Date  . NASAL SEPTUM SURGERY      There were no vitals filed for this visit.   Subjective Assessment - 02/27/21 0933    Subjective Had 69yo and infant over the weekend, was able to carry 69yo around wihtout issue. Feeling stronger.    Patient Stated Goals walk, swim    Currently in Pain? No/denies              [facility] PT Assessment - 02/27/21 0001      Flexibility   Hamstrings limited on rt with concordant discomfort                         [facility] Adult PT Treatment/Exercise - 02/27/21 0001      Lumbar Exercises: Stretches   Passive Hamstring Stretch Limitations supine with strap- mid & lateral bias      Lumbar Exercises: Standing   Other Standing Lumbar Exercises pillow bw knees- cues for gluts      Lumbar Exercises: Seated   Other Seated Lumbar Exercises kneeling chops 2lb- added hip hinge to reach floor, gluts to come upright      Lumbar Exercises: Supine   Bridge Limitations pillow bw knees- cues for gluts      Manual Therapy   Soft tissue mobilization roller Rt hip                        PT Long Term Goals - 02/27/21 0935      PT LONG TERM GOAL #1   Title pt will be able to return to walking program without limitations by back pain    Baseline has not done exercise walks but [facility], just sore at the end of the day.    Status Partially Met      PT LONG TERM GOAL #2   Title able to demo core control for lumbopelvic stability in resisted UE motions    Baseline only used arms to hold baby recently    Status On-going      PT LONG TERM GOAL #3   Title independent with long term core stability training and stretching program    Status On-going                 Plan - 02/27/21 1002    Clinical Impression Statement added focus on glut activation with core in functional motions and  strengthening exercises. rest breaks when needed to stretch via child pose. Leaving for [facility]- will call with questions.    PT Treatment/Interventions ADLs/Self Care Home Management;Aquatic Therapy;Cryotherapy;Ultrasound;Traction;Moist Heat;Iontophoresis 4mg /ml Dexamethasone;Electrical Stimulation;Gait training;Stair training;Functional mobility training;Neuromuscular re-education;Therapeutic exercise;Therapeutic activities;Patient/family education;Manual techniques;Passive range of motion;Dry needling;Taping;Spinal Manipulations;Joint Manipulations    PT Next Visit Plan reevaluate from return of [place] and Agree with Plan of Care Patient           Patient will benefit from skilled therapeutic intervention in order to improve the following deficits and impairments:  Increased muscle spasms,Improper body mechanics,Decreased activity tolerance,Decreased strength,Impaired flexibility,Postural dysfunction,Pain,Difficulty walking  Visit Diagnosis: Chronic bilateral low back pain without sciatica  Muscle weakness (generalized)     Problem List Patient Active Problem List    Diagnosis Date Noted  . Chronic bilateral low back pain without sciatica 01/31/2021  . Acute non-recurrent maxillary sinusitis 10/25/2020  . Vitamin D deficiency disease 07/13/2020  . Chronic hyperglycemia 07/13/2020  . Erectile dysfunction due to arterial insufficiency 07/12/2020  . Routine general medical examination at a health care facility 07/12/2020  . Benign prostatic hyperplasia without lower urinary tract symptoms 07/12/2020  . Hyperlipidemia LDL goal <130 07/12/2020  . PUD (peptic ulcer disease) 07/12/2020  . Elevated systolic blood pressure reading without diagnosis of hypertension 07/12/2020  . Esophageal reflux 08/21/2010  . Personal history of colonic polyps 08/21/2010    Deontez Klinke C. Lisseth Brazeau PT, DPT 02/27/21 11:42 AM   [facility] [place], [place], [address] Phone: [phone]   Fax:  [phone]  Name: Whitt Auletta MRN: [id number] Date of Birth: 19-Feb-1951

## 2021-03-01 ENCOUNTER — Encounter (HOSPITAL_BASED_OUTPATIENT_CLINIC_OR_DEPARTMENT_OTHER): Payer: Medicare HMO | Admitting: Physical Therapy

## 2021-03-02 ENCOUNTER — Encounter (HOSPITAL_BASED_OUTPATIENT_CLINIC_OR_DEPARTMENT_OTHER): Payer: Medicare HMO | Admitting: Physical Therapy

## 2021-03-06 ENCOUNTER — Encounter (HOSPITAL_BASED_OUTPATIENT_CLINIC_OR_DEPARTMENT_OTHER): Payer: Medicare HMO | Admitting: Physical Therapy

## 2021-03-08 ENCOUNTER — Other Ambulatory Visit: Payer: Self-pay

## 2021-03-08 ENCOUNTER — Ambulatory Visit (HOSPITAL_BASED_OUTPATIENT_CLINIC_OR_DEPARTMENT_OTHER): Payer: Medicare HMO | Admitting: Physical Therapy

## 2021-03-08 ENCOUNTER — Encounter (HOSPITAL_BASED_OUTPATIENT_CLINIC_OR_DEPARTMENT_OTHER): Payer: Self-pay | Admitting: Physical Therapy

## 2021-03-08 DIAGNOSIS — G8929 Other chronic pain: Secondary | ICD-10-CM

## 2021-03-08 DIAGNOSIS — M6281 Muscle weakness (generalized): Secondary | ICD-10-CM

## 2021-03-08 DIAGNOSIS — M545 Low back pain, unspecified: Secondary | ICD-10-CM

## 2021-03-08 NOTE — Therapy (Signed)
[facility] [address] [place], [place], [address] Phone: [phone]   Fax:  [phone]  Physical Therapy Treatment  Patient Details  Name: Zachary Burke MRN: [id number] Date of Birth: Jan 18, 1951 Referring Provider (PT): Lynne Leader, MD   Encounter Date: 03/08/2021   PT End of Session - 03/08/21 0935    Visit Number 5    Number of Visits 9    Date for PT Re-Evaluation 03/17/21    Authorization Type aetna MCR    Progress Note Due on Visit 10    PT Start Time 0932    PT Stop Time 1011    PT Time Calculation (min) 39 min    Activity Tolerance Patient tolerated treatment well    Behavior During Therapy [facility] for tasks assessed/performed           Past Medical History:  Diagnosis Date  . Basal cell carcinoma   . Colon polyps    2002,2006  . GERD (gastroesophageal reflux disease)   . Hiatal hernia     Past Surgical History:  Procedure Laterality Date  . NASAL SEPTUM SURGERY      There were no vitals filed for this visit.   Subjective Assessment - 03/08/21 0936    Subjective drove to and back from [facility] with some tightness in Lt lower back.                             [facility] Adult PT Treatment/Exercise - 03/08/21 0001      Therapeutic Activites    Therapeutic Activities ADL's    ADL's postural alignment in seated and standing      Lumbar Exercises: Stretches   Passive Hamstring Stretch Limitations all 3 angles supine with strap    Piriformis Stretch Limitations pull across & figure 4    Other Lumbar Stretch Exercise single knee to shoulder      Lumbar Exercises: Quadruped   Other Quadruped Lumbar Exercises fire hydrant, bird dog      Manual Therapy   Manual therapy comments edu on self TrP wit tennis ball    Soft tissue mobilization trigger point release Lt lumbar paraspinals, QL Lt hip abductors & ERs                       PT Long Term Goals - 02/27/21 0935      PT  LONG TERM GOAL #1   Title pt will be able to return to walking program without limitations by back pain    Baseline has not done exercise walks but [facility], just sore at the end of the day.    Status Partially Met      PT LONG TERM GOAL #2   Title able to demo core control for lumbopelvic stability in resisted UE motions    Baseline only used arms to hold baby recently    Status On-going      PT LONG TERM GOAL #3   Title independent with long term core stability training and stretching program    Status On-going                 Plan - 03/08/21 1012    Clinical Impression Statement Was able to drive to [facility] wihtout significant pain but did have tension along Lt lumbar region and hip region. Reduced with manual therapy and reinforced core engagement in stability exercises. Will  continue stretching and we discussed different angles and methods of stretching. POC ends next week. Encouraged him to try returning to walking by building up endurance.    PT Treatment/Interventions ADLs/Self Care Home Management;Aquatic Therapy;Cryotherapy;Ultrasound;Traction;Moist Heat;Iontophoresis 4mg /ml Dexamethasone;Electrical Stimulation;Gait training;Stair training;Functional mobility training;Neuromuscular re-education;Therapeutic exercise;Therapeutic activities;Patient/family education;Manual techniques;Passive range of motion;Dry needling;Taping;Spinal Manipulations;Joint Manipulations    PT Next Visit Plan did he try walking, end of [place] and Agree with Plan of Care Patient           Patient will benefit from skilled therapeutic intervention in order to improve the following deficits and impairments:  Increased muscle spasms,Improper body mechanics,Decreased activity tolerance,Decreased strength,Impaired flexibility,Postural dysfunction,Pain,Difficulty walking  Visit Diagnosis: Chronic bilateral low back pain  without sciatica  Muscle weakness (generalized)     Problem List Patient Active Problem List   Diagnosis Date Noted  . Chronic bilateral low back pain without sciatica 01/31/2021  . Acute non-recurrent maxillary sinusitis 10/25/2020  . Vitamin D deficiency disease 07/13/2020  . Chronic hyperglycemia 07/13/2020  . Erectile dysfunction due to arterial insufficiency 07/12/2020  . Routine general medical examination at a health care facility 07/12/2020  . Benign prostatic hyperplasia without lower urinary tract symptoms 07/12/2020  . Hyperlipidemia LDL goal <130 07/12/2020  . PUD (peptic ulcer disease) 07/12/2020  . Elevated systolic blood pressure reading without diagnosis of hypertension 07/12/2020  . Esophageal reflux 08/21/2010  . Personal history of colonic polyps 08/21/2010    Zachary Burke PT, DPT 03/08/21 10:38 AM   [facility] [place], [place], [address] Phone: [phone]   Fax:  [phone]  Name: Zachary Burke MRN: [id number] Date of Birth: 1951/01/20

## 2021-03-09 DIAGNOSIS — H2513 Age-related nuclear cataract, bilateral: Secondary | ICD-10-CM | POA: Diagnosis not present

## 2021-03-09 DIAGNOSIS — H524 Presbyopia: Secondary | ICD-10-CM | POA: Diagnosis not present

## 2021-03-13 ENCOUNTER — Encounter (HOSPITAL_BASED_OUTPATIENT_CLINIC_OR_DEPARTMENT_OTHER): Payer: Self-pay | Admitting: Physical Therapy

## 2021-03-13 ENCOUNTER — Other Ambulatory Visit: Payer: Self-pay

## 2021-03-13 ENCOUNTER — Ambulatory Visit (HOSPITAL_BASED_OUTPATIENT_CLINIC_OR_DEPARTMENT_OTHER): Payer: Medicare HMO | Admitting: Physical Therapy

## 2021-03-13 DIAGNOSIS — M545 Low back pain, unspecified: Secondary | ICD-10-CM | POA: Diagnosis not present

## 2021-03-13 DIAGNOSIS — M6281 Muscle weakness (generalized): Secondary | ICD-10-CM | POA: Diagnosis not present

## 2021-03-13 DIAGNOSIS — G8929 Other chronic pain: Secondary | ICD-10-CM | POA: Diagnosis not present

## 2021-03-13 NOTE — Therapy (Signed)
[facility] [address] [place], [place], [address] Phone: [phone]   Fax:  [phone]  Physical Therapy Treatment/Discharge  Patient Details  Name: Zachary Burke MRN: [id number] Date of Birth: 1951-02-14 Referring Provider (PT): Lynne Leader, MD   Encounter Date: 03/13/2021   PT End of Session - 03/13/21 0933    Visit Number 6    Number of Visits 9    Date for PT Re-Evaluation 03/17/21    Authorization Type aetna MCR    PT Start Time 0930    PT Stop Time 1008    PT Time Calculation (min) 38 min    Activity Tolerance Patient tolerated treatment well    Behavior During Therapy [facility] for tasks assessed/performed           Past Medical History:  Diagnosis Date  . Basal cell carcinoma   . Colon polyps    2002,2006  . GERD (gastroesophageal reflux disease)   . Hiatal hernia     Past Surgical History:  Procedure Laterality Date  . NASAL SEPTUM SURGERY      There were no vitals filed for this visit.   Subjective Assessment - 03/13/21 0932    Subjective Just some tightness in Lt buttock region. 2 hr walk yesterday that was not painful.    Patient Stated Goals walk, swim    Currently in Pain? No/denies                             [facility] Adult PT Treatment/Exercise - 03/13/21 0001      Lumbar Exercises: Stretches   Passive Hamstring Stretch Limitations supine with strap- midline & lateral    Lower Trunk Rotation Limitations with deep breathing    Quad Stretch Limitations prone with strap    Other Lumbar Stretch Exercise pigeon pose                  PT Education - 03/13/21 1855    Education Details importance of continued stretching, early recognition and long term care.    Person(s) Educated Patient    Methods Explanation    Comprehension Verbalized understanding               PT Long Term Goals - 03/13/21 1004      PT LONG TERM GOAL #1   Title pt will be able to return  to walking program without limitations by back pain    Status Achieved      PT LONG TERM GOAL #2   Title able to demo core control for lumbopelvic stability in resisted UE motions    Status Achieved      PT LONG TERM GOAL #3   Title independent with long term core stability training and stretching program    Status Achieved                 Plan - 03/13/21 1017    Clinical Impression Statement Pt has met all of his goals at this time and is prepared for d/c to independent program. encouraged him to continue with HEP and contact with any questions    PT Treatment/Interventions ADLs/Self Care Home Management;Aquatic Therapy;Cryotherapy;Ultrasound;Traction;Moist Heat;Iontophoresis 39m/ml Dexamethasone;Electrical Stimulation;Gait training;Stair training;Functional mobility training;Neuromuscular re-education;Therapeutic exercise;Therapeutic activities;Patient/family education;Manual techniques;Passive range of motion;Dry needling;Taping;Spinal Manipulations;Joint Manipulations    PT Home Exercise Plan [email or web address]    Consulted and Agree with Plan of Care Patient  Patient will benefit from skilled therapeutic intervention in order to improve the following deficits and impairments:  Increased muscle spasms,Improper body mechanics,Decreased activity tolerance,Decreased strength,Impaired flexibility,Postural dysfunction,Pain,Difficulty walking  Visit Diagnosis: Chronic bilateral low back pain without sciatica  Muscle weakness (generalized)     Problem List Patient Active Problem List   Diagnosis Date Noted  . Chronic bilateral low back pain without sciatica 01/31/2021  . Acute non-recurrent maxillary sinusitis 10/25/2020  . Vitamin D deficiency disease 07/13/2020  . Chronic hyperglycemia 07/13/2020  . Erectile dysfunction due to arterial insufficiency 07/12/2020  . Routine general medical examination at a health care facility 07/12/2020  . Benign prostatic hyperplasia  without lower urinary tract symptoms 07/12/2020  . Hyperlipidemia LDL goal <130 07/12/2020  . PUD (peptic ulcer disease) 07/12/2020  . Elevated systolic blood pressure reading without diagnosis of hypertension 07/12/2020  . Esophageal reflux 08/21/2010  . Personal history of colonic polyps 08/21/2010   Glinda Natzke C. Fernado Brigante PT, DPT 03/13/21 6:56 PM  PHYSICAL THERAPY DISCHARGE SUMMARY  Visits from Start of Care: 6  Current functional level related to goals / functional outcomes: See above   Remaining deficits: See above   Education / Equipment: Anatomy of condition, POC, HEP, exercise form/rationale  Plan: Patient agrees to discharge.  Patient goals were met. Patient is being discharged due to meeting the stated rehab goals.  ?????    .  .   [facility] [place], [place], [address] Phone: [phone]   Fax:  [phone]  Name: Zachary Burke MRN: [id number] Date of Birth: February 02, 1951

## 2021-03-15 ENCOUNTER — Ambulatory Visit (HOSPITAL_BASED_OUTPATIENT_CLINIC_OR_DEPARTMENT_OTHER): Payer: Medicare HMO | Admitting: Physical Therapy

## 2021-03-20 ENCOUNTER — Encounter: Payer: Self-pay | Admitting: Internal Medicine

## 2021-04-13 ENCOUNTER — Encounter (HOSPITAL_BASED_OUTPATIENT_CLINIC_OR_DEPARTMENT_OTHER): Payer: Self-pay | Admitting: Physical Therapy

## 2021-05-17 ENCOUNTER — Encounter: Payer: Self-pay | Admitting: Internal Medicine

## 2021-06-05 ENCOUNTER — Encounter: Payer: Self-pay | Admitting: Internal Medicine

## 2021-07-02 ENCOUNTER — Encounter: Payer: Self-pay | Admitting: Internal Medicine

## 2021-07-04 ENCOUNTER — Ambulatory Visit (INDEPENDENT_AMBULATORY_CARE_PROVIDER_SITE_OTHER): Payer: Medicare HMO | Admitting: Internal Medicine

## 2021-07-04 ENCOUNTER — Encounter: Payer: Self-pay | Admitting: Internal Medicine

## 2021-07-04 ENCOUNTER — Other Ambulatory Visit: Payer: Self-pay

## 2021-07-04 VITALS — BP 122/76 | HR 79 | Temp 97.9°F | Ht 71.5 in | Wt 177.0 lb

## 2021-07-04 DIAGNOSIS — R6889 Other general symptoms and signs: Secondary | ICD-10-CM | POA: Diagnosis not present

## 2021-07-04 DIAGNOSIS — R030 Elevated blood-pressure reading, without diagnosis of hypertension: Secondary | ICD-10-CM

## 2021-07-04 LAB — CBC WITH DIFFERENTIAL/PLATELET
Basophils Absolute: 0 10*3/uL (ref 0.0–0.1)
Basophils Relative: 0.8 % (ref 0.0–3.0)
Eosinophils Absolute: 0.2 10*3/uL (ref 0.0–0.7)
Eosinophils Relative: 3.3 % (ref 0.0–5.0)
HCT: 46.6 % (ref 39.0–52.0)
Hemoglobin: 15.6 g/dL (ref 13.0–17.0)
Lymphocytes Relative: 23.2 % (ref 12.0–46.0)
Lymphs Abs: 1.2 10*3/uL (ref 0.7–4.0)
MCHC: 33.4 g/dL (ref 30.0–36.0)
MCV: 89 fl (ref 78.0–100.0)
Monocytes Absolute: 0.6 10*3/uL (ref 0.1–1.0)
Monocytes Relative: 12.5 % — ABNORMAL HIGH (ref 3.0–12.0)
Neutro Abs: 3.1 10*3/uL (ref 1.4–7.7)
Neutrophils Relative %: 60.2 % (ref 43.0–77.0)
Platelets: 207 10*3/uL (ref 150.0–400.0)
RBC: 5.23 Mil/uL (ref 4.22–5.81)
RDW: 13.6 % (ref 11.5–15.5)
WBC: 5.1 10*3/uL (ref 4.0–10.5)

## 2021-07-04 LAB — BASIC METABOLIC PANEL
BUN: 16 mg/dL (ref 6–23)
CO2: 31 mEq/L (ref 19–32)
Calcium: 9.9 mg/dL (ref 8.4–10.5)
Chloride: 100 mEq/L (ref 96–112)
Creatinine, Ser: 1.02 mg/dL (ref 0.40–1.50)
GFR: 74.97 mL/min (ref >60.00)
Glucose, Bld: 86 mg/dL (ref 70–99)
Potassium: 4.8 mEq/L (ref 3.5–5.1)
Sodium: 137 mEq/L (ref 135–145)

## 2021-07-04 NOTE — Patient Instructions (Signed)
Preventing Heat Exhaustion, Adult Heat exhaustion happens when your body gets too hot (overheated) from hot weather, exercise, strenuous physical activity, dehydration, or a combination of these. If untreated, heat exhaustion could lead to heat stroke,which is a medical emergency and can be life-threatening. How can heat exhaustion affect me? Early warning signs of heat exhaustion include: Weakness. Fatigue. Heat rash. This red, blotchy skin rash is also called prickly heat. Swelling of the legs and feet. Stomach cramps. Arm pain. Leg cramps. When heat exhaustion symptoms progress, they can include: Heavy sweating. Body temperature of 102.67F (39C) or higher. Clammy skin. Rapid, weak pulse. Nausea or vomiting. Dizziness. Headache. Difficulty focusing or concentrating. Fainting. If you have signs of heat exhaustion, move to a cool place, loosen your clothing, and drink water or a sports drink. Then do these things: Put cool, wet compresses on your body or get into a cool bath or shower. Remove all unnecessary clothing to expose as much skin as possible to ventilated air. Fans and cool water mists over your skin are very important. What can increase my risk? People who work or exercise outside in hot weather have the highest risk of heat exhaustion. You may also be at higher risk if you: Are over age 73. Older adults have a greater risk for heat exhaustion than younger adults. Live alone, especially in a home without air conditioning or proper ventilation. Are very overweight (obese). Have high blood pressure. Have certain chronic medical conditions, such as heart disease, poor circulation or other vascular diseases, sickle cell disease, high blood pressure, diabetes, lung disease, or cystic fibrosis, or you have had a stroke. Take certain medicines for high blood pressure, or you take antihistamines or tranquilizers. Drink alcohol excessively or use drugs, such as cocaine, heroin, or  amphetamines. What actions can I take to prevent heat exhaustion? Eating and drinking     Drink enough water or sports drinks to keep your urine pale yellow. When it is hot, drink every 15 to 20 minutes, even if you are not thirsty. Do not go out in the heat after a heavy meal. Do not drink alcohol or caffeinated drinks when it is very hot outside. Lifestyle Avoid being outside on very hot days. Check your local news for extreme heat alerts or warnings. In extreme heat, stay in an air-conditioned environment until the temperature cools off. Wear lightweight, light-colored, and loose-fitting clothing in warm weather. This allows for better air circulation over the skin. Protect yourself from the sun by wearing a broad-brimmed hat and using a broad-spectrum sunscreen that is SPF 15 or higher. Activity Check with your health care provider before starting any new exercise or activity. Ask about any health conditions or medicines that might increase your risk for heat exhaustion. Do outdoor activities when it is cooler. This may be in the morning, late afternoon, or evening. Take breaks in the shade. Do not work or exercise in the heat when you feel unwell or have been sick. Start any new work or exercise activity gradually. General information Older adults need extra precautions to protect against heat exhaustion. Follow these tips: Do not leave an older adult alone in a hot car. Make sure older adults have access to air conditioning on very hot days. Remind them to drink enough fluids. Check on them at least twice a day if you can. Where to find more information Centers for Disease Control and Prevention: http://www.wolf.info/ American Academy [employer]icians: familydoctor.[employer] [employer]: orthoinfo.aaos.org Contact  a health care provider if you: Faint. Feel weak or dizzy. Have any signs or symptoms of heat exhaustion that last more than 1 hour. These may include  muscle cramps, fatigue, redness of the face and neck, and headache. Get help right away if you: Have signs of heat stroke, including: Body temperature of 104F (40C) or higher. Hot, dry, red skin. Fast, thumping pulse. Severe nausea, vomiting, or both. Confusion. These symptoms may represent a serious problem that is an emergency. Do not wait to see if the symptoms will go away. Get medical help right away. Call your local emergency services (911 in the [place] Do not drive yourself to the hospital. Summary Heat exhaustion happens when your body gets too hot (overheated) from hot weather, exercise, strenuous physical activity, dehydration, or a combination of these. Avoid being outside on very hot days. When you are out in the heat, stay hydrated, wear light clothing, and take breaks in shaded areas to cool down. If you have signs of heat exhaustion, get out of the heat, drink fluids, take steps to cool down, and contact a health care provider. Cooling down may include taking off all unnecessary clothing, using cooling fans, and water misting. Make sure you are drinking fluids, such as water or sports drinks with electrolytes. Contact a health care provider if you have signs or symptoms of heat exhaustion that last more than 1 hour, such as muscle cramps, fatigue, and headache. This information is not intended to replace advice given to you by your health care provider. Make sure you discuss any questions you have with your healthcare provider. Document Revised: 05/02/2020 Document Reviewed: 05/02/2020 Elsevier Patient Education  [employer].

## 2021-07-04 NOTE — Progress Notes (Signed)
Subjective:  Patient ID: Zachary Burke, male    DOB: 1951-01-28  Age: 70 y.o. MRN: [id number]  CC: Hypertension  This visit occurred during the SARS-CoV-2 public health emergency.  Safety protocols were in place, including screening questions prior to the visit, additional usage of staff PPE, and extensive cleaning of exam room while observing appropriate contact time as indicated for disinfecting solutions.    HPI Zachary Burke presents for f/up - He has a history of hypertension but is not currently taking an antihypertensive.  He reports that his blood pressure increases with exercise but never above 140/90.  He complains of a 5-year history of worsening heat intolerance.  He walks several miles a day and does not experience chest pain, shortness of breath, diaphoresis, palpitations, dizziness, lightheadedness, or edema.  Outpatient Medications Prior to Visit  Medication Sig Dispense Refill   Cholecalciferol 50 MCG (2000 UT) TABS Take 1 tablet (2,000 Units total) by mouth daily. 90 tablet 1   psyllium (METAMUCIL) 58.6 % packet Take 1 packet by mouth daily.     tiZANidine (ZANAFLEX) 2 MG tablet Take 1 tablet (2 mg total) by mouth every 8 (eight) hours as needed for muscle spasms. 90 tablet 0   No facility-administered medications prior to visit.    ROS Review of Systems  Constitutional:  Negative for diaphoresis and fatigue.  HENT: Negative.    Respiratory:  Negative for cough, chest tightness, shortness of breath and wheezing.   Cardiovascular:  Negative for chest pain, palpitations and leg swelling.  Gastrointestinal:  Negative for abdominal pain, diarrhea and nausea.  Endocrine: Positive for heat intolerance. Negative for cold intolerance.  Genitourinary: Negative.  Negative for difficulty urinating.  Musculoskeletal: Negative.  Negative for neck pain.  Skin: Negative.   Neurological:  Negative for dizziness, seizures, speech difficulty, weakness, light-headedness, numbness  and headaches.  Hematological:  Negative for adenopathy. Does not bruise/bleed easily.  Psychiatric/Behavioral: Negative.     Objective:  BP 122/76 (BP Location: Left Arm, Patient Position: Sitting, Cuff Size: Large)   Pulse 79   Temp 97.9 F (36.6 C) (Oral)   Ht 5' 11.5" (1.816 m)   Wt 177 lb (80.3 kg)   SpO2 97%   BMI 24.34 kg/m   BP Readings from Last 3 Encounters:  07/04/21 122/76  02/06/21 121/74  07/12/20 136/84    Wt Readings from Last 3 Encounters:  07/04/21 177 lb (80.3 kg)  02/06/21 177 lb 9.6 oz (80.6 kg)  07/12/20 183 lb (83 kg)    Physical Exam Vitals reviewed.  Constitutional:      Appearance: Normal appearance.  HENT:     Nose: Nose normal.     Mouth/Throat:     Mouth: Mucous membranes are moist.  Eyes:     General: No scleral icterus.    Conjunctiva/sclera: Conjunctivae normal.  Cardiovascular:     Rate and Rhythm: Normal rate and regular rhythm.     Heart sounds: No murmur heard.    Comments: EKG- NSR, 64 bpm Normal EKG Pulmonary:     Effort: Pulmonary effort is normal.     Breath sounds: No stridor. No wheezing, rhonchi or rales.  Abdominal:     General: Abdomen is flat.     Palpations: There is no mass.     Tenderness: There is no abdominal tenderness. There is no guarding.     Hernia: No hernia is present.  Musculoskeletal:        General: Normal range of motion.  Cervical back: Neck supple.     Right lower leg: No edema.     Left lower leg: No edema.  Skin:    General: Skin is warm and dry.  Neurological:     General: No focal deficit present.     Mental Status: He is alert.  Psychiatric:        Mood and Affect: Mood normal.        Behavior: Behavior normal.    Lab Results  Component Value Date   WBC 5.1 07/04/2021   HGB 15.6 07/04/2021   HCT 46.6 07/04/2021   PLT 207.0 07/04/2021   GLUCOSE 86 07/04/2021   CHOL 170 07/12/2020   TRIG 151 (H) 07/12/2020   HDL 40 07/12/2020   LDLCALC 104 (H) 07/12/2020   ALT 13  07/12/2020   AST 16 07/12/2020   NA 137 07/04/2021   K 4.8 07/04/2021   CL 100 07/04/2021   CREATININE 1.02 07/04/2021   BUN 16 07/04/2021   CO2 31 07/04/2021   TSH 2.63 07/04/2021   PSA 1.0 07/12/2020   HGBA1C 5.5 07/12/2020    MR LUMBAR SPINE WO CONTRAST  Result Date: 11/09/2018 CLINICAL DATA:  Acute low back pain without sciatica EXAM: MRI LUMBAR SPINE WITHOUT CONTRAST TECHNIQUE: Multiplanar, multisequence MR imaging of the lumbar spine was performed. No intravenous contrast was administered. COMPARISON:  None. FINDINGS: Segmentation:  Normal Alignment:  Slight retrolisthesis L3-4. Mild anterolisthesis L4-5 Vertebrae:  Negative for fracture or mass. Conus medullaris and cauda equina: Conus extends to the L1-2 level. Conus and cauda equina appear normal. Paraspinal and other soft tissues: Negative for paraspinous mass or adenopathy Disc levels: L1-2: Negative L2-3: Normal disc space.  Mild facet degeneration without stenosis L3-4: Disc degeneration with disc bulging and endplate spurring. Moderate facet hypertrophy bilaterally. Moderate spinal stenosis. Moderate subarticular and foraminal stenosis bilaterally. L4-5: Mild anterolisthesis. Small left-sided disc protrusion with subarticular stenosis and possible impingement of the left L5 nerve root. Moderate to advanced facet hypertrophy bilaterally with mild spinal stenosis. Moderate foraminal stenosis bilaterally L5-S1: Shallow central disc protrusion without significant neural impingement IMPRESSION: Moderate spinal stenosis L3-4 with moderate subarticular and foraminal stenosis bilaterally Small left-sided disc protrusion L4-5 with possible impingement left L5 nerve root. Mild spinal stenosis and moderate foraminal stenosis bilaterally Small central disc protrusion L5-S1 without neural impingement. Electronically Signed   By: Franchot Gallo M.D.   On: 11/09/2018 09:50    Assessment & Plan:   Zachary Burke was seen today for  hypertension.  Diagnoses and all orders for this visit:  Elevated systolic blood pressure reading without diagnosis of hypertension- His blood pressure is adequately well controlled.  Labs are negative for secondary causes.  His EKG is reassuring.  I do not think antihypertensive therapy is indicated. -     EKG 12-Lead -     CBC with Differential/Platelet; Future -     Basic metabolic panel; Future -     Thyroid Panel With TSH; Future -     Thyroid Panel With TSH -     Basic metabolic panel -     CBC with Differential/Platelet  Heat intolerance- Labs are negative for secondary causes.  I think this is related to the normal aging process. -     CBC with Differential/Platelet; Future -     Basic metabolic panel; Future -     Thyroid Panel With TSH; Future -     Thyroid Panel With TSH -     Basic metabolic panel -  CBC with Differential/Platelet  I am having Zachary Burke maintain his psyllium, Cholecalciferol, and tiZANidine.  No orders of the defined types were placed in this encounter.    Follow-up: Return in about 3 months (around 10/04/2021).  Scarlette Calico, MD

## 2021-07-05 ENCOUNTER — Encounter: Payer: Self-pay | Admitting: Internal Medicine

## 2021-07-05 ENCOUNTER — Other Ambulatory Visit: Payer: Self-pay | Admitting: Internal Medicine

## 2021-07-05 DIAGNOSIS — R0683 Snoring: Secondary | ICD-10-CM

## 2021-07-05 LAB — THYROID PANEL WITH TSH
Free Thyroxine Index: 2.2 (ref 1.4–3.8)
T3 Uptake: 30 % (ref 22–35)
T4, Total: 7.2 ug/dL (ref 4.9–10.5)
TSH: 2.63 mIU/L (ref 0.40–4.50)

## 2021-07-19 ENCOUNTER — Encounter: Payer: Self-pay | Admitting: Internal Medicine

## 2021-08-21 ENCOUNTER — Encounter: Payer: Self-pay | Admitting: Internal Medicine

## 2021-08-23 ENCOUNTER — Other Ambulatory Visit: Payer: Self-pay

## 2021-08-23 ENCOUNTER — Encounter: Payer: Self-pay | Admitting: Internal Medicine

## 2021-08-23 ENCOUNTER — Ambulatory Visit (INDEPENDENT_AMBULATORY_CARE_PROVIDER_SITE_OTHER): Payer: Medicare HMO | Admitting: Internal Medicine

## 2021-08-23 DIAGNOSIS — J0100 Acute maxillary sinusitis, unspecified: Secondary | ICD-10-CM

## 2021-08-23 MED ORDER — AMOXICILLIN-POT CLAVULANATE 600-42.9 MG/5ML PO SUSR: 600.0000 mg | Freq: Three times a day (TID) | ORAL | 0 refills | Status: DC

## 2021-08-23 MED ORDER — HYDROCODONE BIT-HOMATROP MBR 5-1.5 MG/5ML PO SOLN
5.0000 mL | Freq: Three times a day (TID) | ORAL | 0 refills | Status: DC | PRN
Start: 2021-08-23 — End: 2021-10-03

## 2021-08-23 NOTE — Progress Notes (Signed)
Subjective:  Patient ID: Zachary Burke, male    DOB: 08/13/1951  Age: 70 y.o. MRN: [id number]  CC: Sinusitis  This visit occurred during the SARS-CoV-2 public health emergency.  Safety protocols were in place, including screening questions prior to the visit, additional usage of staff PPE, and extensive cleaning of exam room while observing appropriate contact time as indicated for disinfecting solutions.    HPI Zachary Burke presents for f/up -  He developed URI symptoms about a month ago.  Over the last week his symptoms have worsened with facial pain, cough productive of thick yellow phlegm and yellow nasal phlegm with facial pain and sore throat.  Outpatient Medications Prior to Visit  Medication Sig Dispense Refill   Cholecalciferol 50 MCG (2000 UT) TABS Take 1 tablet (2,000 Units total) by mouth daily. 90 tablet 1   psyllium (METAMUCIL) 58.6 % packet Take 1 packet by mouth daily.     tiZANidine (ZANAFLEX) 2 MG tablet Take 1 tablet (2 mg total) by mouth every 8 (eight) hours as needed for muscle spasms. 90 tablet 0   No facility-administered medications prior to visit.    ROS Review of Systems  Constitutional:  Negative for chills, diaphoresis, fatigue and fever.  HENT:  Positive for rhinorrhea, sinus pressure, sore throat and voice change. Negative for ear pain, facial swelling, postnasal drip and sneezing.   Respiratory:  Positive for cough. Negative for chest tightness, shortness of breath and wheezing.   Cardiovascular:  Negative for chest pain, palpitations and leg swelling.  Gastrointestinal:  Negative for abdominal pain, constipation, diarrhea, nausea and vomiting.  Genitourinary: Negative.  Negative for difficulty urinating and hematuria.  Musculoskeletal: Negative.   Skin: Negative.  Negative for rash.  Neurological: Negative.  Negative for dizziness and weakness.  Hematological:  Negative for adenopathy. Does not bruise/bleed easily.  Psychiatric/Behavioral:  Negative.     Objective:  BP 118/78 (BP Location: Left Arm, Patient Position: Sitting, Cuff Size: Large)   Pulse 82   Temp 98.5 F (36.9 C) (Oral)   Resp 16   Ht 5' 11.5" (1.816 m)   Wt 179 lb (81.2 kg)   SpO2 97%   BMI 24.62 kg/m   BP Readings from Last 3 Encounters:  08/23/21 118/78  07/04/21 122/76  02/06/21 121/74    Wt Readings from Last 3 Encounters:  08/23/21 179 lb (81.2 kg)  07/04/21 177 lb (80.3 kg)  02/06/21 177 lb 9.6 oz (80.6 kg)    Physical Exam Vitals reviewed.  Constitutional:      General: He is not in acute distress.    Appearance: He is not ill-appearing, toxic-appearing or diaphoretic.  HENT:     Right Ear: Tympanic membrane normal.     Left Ear: Tympanic membrane normal.     Nose: Rhinorrhea present. Rhinorrhea is purulent.     Right Nostril: No epistaxis.     Left Nostril: No epistaxis.     Right Sinus: Maxillary sinus tenderness present. No frontal sinus tenderness.     Left Sinus: Maxillary sinus tenderness present. No frontal sinus tenderness.     Mouth/Throat:     Mouth: Mucous membranes are moist.     Pharynx: No oropharyngeal exudate or posterior oropharyngeal erythema.  Eyes:     General: No scleral icterus.    Conjunctiva/sclera: Conjunctivae normal.  Cardiovascular:     Rate and Rhythm: Normal rate and regular rhythm.     Heart sounds: No murmur heard. Pulmonary:     Effort: Pulmonary effort  is normal.     Breath sounds: No stridor. No wheezing, rhonchi or rales.  Abdominal:     General: Abdomen is flat.     Palpations: There is no mass.     Tenderness: There is no abdominal tenderness. There is no guarding.  Musculoskeletal:        General: Normal range of motion.     Cervical back: Neck supple.     Right lower leg: No edema.     Left lower leg: No edema.  Lymphadenopathy:     Cervical: No cervical adenopathy.  Skin:    General: Skin is warm and dry.     Findings: No rash.  Neurological:     General: No focal deficit  present.     Mental Status: He is alert.  Psychiatric:        Mood and Affect: Mood normal.        Behavior: Behavior normal.    Lab Results  Component Value Date   WBC 5.1 07/04/2021   HGB 15.6 07/04/2021   HCT 46.6 07/04/2021   PLT 207.0 07/04/2021   GLUCOSE 86 07/04/2021   CHOL 170 07/12/2020   TRIG 151 (H) 07/12/2020   HDL 40 07/12/2020   LDLCALC 104 (H) 07/12/2020   ALT 13 07/12/2020   AST 16 07/12/2020   NA 137 07/04/2021   K 4.8 07/04/2021   CL 100 07/04/2021   CREATININE 1.02 07/04/2021   BUN 16 07/04/2021   CO2 31 07/04/2021   TSH 2.63 07/04/2021   PSA 1.0 07/12/2020   HGBA1C 5.5 07/12/2020    MR LUMBAR SPINE WO CONTRAST  Result Date: 11/09/2018 CLINICAL DATA:  Acute low back pain without sciatica EXAM: MRI LUMBAR SPINE WITHOUT CONTRAST TECHNIQUE: Multiplanar, multisequence MR imaging of the lumbar spine was performed. No intravenous contrast was administered. COMPARISON:  None. FINDINGS: Segmentation:  Normal Alignment:  Slight retrolisthesis L3-4. Mild anterolisthesis L4-5 Vertebrae:  Negative for fracture or mass. Conus medullaris and cauda equina: Conus extends to the L1-2 level. Conus and cauda equina appear normal. Paraspinal and other soft tissues: Negative for paraspinous mass or adenopathy Disc levels: L1-2: Negative L2-3: Normal disc space.  Mild facet degeneration without stenosis L3-4: Disc degeneration with disc bulging and endplate spurring. Moderate facet hypertrophy bilaterally. Moderate spinal stenosis. Moderate subarticular and foraminal stenosis bilaterally. L4-5: Mild anterolisthesis. Small left-sided disc protrusion with subarticular stenosis and possible impingement of the left L5 nerve root. Moderate to advanced facet hypertrophy bilaterally with mild spinal stenosis. Moderate foraminal stenosis bilaterally L5-S1: Shallow central disc protrusion without significant neural impingement IMPRESSION: Moderate spinal stenosis L3-4 with moderate subarticular  and foraminal stenosis bilaterally Small left-sided disc protrusion L4-5 with possible impingement left L5 nerve root. Mild spinal stenosis and moderate foraminal stenosis bilaterally Small central disc protrusion L5-S1 without neural impingement. Electronically Signed   By: Franchot Gallo M.D.   On: 11/09/2018 09:50    Assessment & Plan:   Paxson was seen today for sinusitis.  Diagnoses and all orders for this visit:  Acute non-recurrent maxillary sinusitis- I will treat the infection with Augmentin and will treat the symptoms with hydrocodone/homatroprine. -     amoxicillin-clavulanate (AUGMENTIN) 600-42.9 MG/5ML suspension; Take 5 mLs (600 mg total) by mouth with breakfast, with lunch, and with evening meal for 10 days. -     HYDROcodone bit-homatropine (HYCODAN) 5-1.5 MG/5ML syrup; Take 5 mLs by mouth every 8 (eight) hours as needed for cough.  I am having Zachary Burke start on  HYDROcodone bit-homatropine. I am also having him maintain his psyllium, Cholecalciferol, tiZANidine, and amoxicillin-clavulanate.  Meds ordered this encounter  Medications   amoxicillin-clavulanate (AUGMENTIN) 600-42.9 MG/5ML suspension    Sig: Take 5 mLs (600 mg total) by mouth with breakfast, with lunch, and with evening meal for 10 days.    Dispense:  200 mL    Refill:  0   HYDROcodone bit-homatropine (HYCODAN) 5-1.5 MG/5ML syrup    Sig: Take 5 mLs by mouth every 8 (eight) hours as needed for cough.    Dispense:  120 mL    Refill:  0     Follow-up: Return in about 3 weeks (around 09/13/2021).  Scarlette Calico, MD

## 2021-08-23 NOTE — Patient Instructions (Signed)

## 2021-08-27 ENCOUNTER — Encounter: Payer: Self-pay | Admitting: Internal Medicine

## 2021-08-29 ENCOUNTER — Other Ambulatory Visit: Payer: Self-pay

## 2021-08-29 ENCOUNTER — Ambulatory Visit (INDEPENDENT_AMBULATORY_CARE_PROVIDER_SITE_OTHER): Payer: Medicare HMO

## 2021-08-29 VITALS — BP 122/80 | HR 63 | Temp 98.2°F | Ht 72.0 in | Wt 181.4 lb

## 2021-08-29 DIAGNOSIS — Z0000 Encounter for general adult medical examination without abnormal findings: Secondary | ICD-10-CM

## 2021-08-29 NOTE — Patient Instructions (Signed)
Zachary Burke , Thank you for taking time to come for your Medicare Wellness Visit. I appreciate your ongoing commitment to your health goals. Please review the following plan we discussed and let me know if I can assist you in the future.   Screening recommendations/referrals: Colonoscopy: 12/15/2018; due every 10 years Recommended yearly ophthalmology/optometry visit for glaucoma screening and checkup Recommended yearly dental visit for hygiene and checkup  Vaccinations: Influenza vaccine: 07/26/2021 Pneumococcal vaccine: 09/19/2017, 09/25/2018 Tdap vaccine: 12/07/2014; due every 10 years Shingles vaccine: never done; Please call your insurance company to determine your out of pocket expense for the Shingrix vaccine. You may receive this vaccine at your local pharmacy. Covid-19: 12/08/2019, 12/29/2019, 08/28/2020, 07/26/2021  Advanced directives: Advance directive discussed with you today. I have provided a copy for you to complete at home and have notarized. Once this is complete please bring a copy in to our office so we can scan it into your chart.  Conditions/risks identified: Yes; Client understands the importance of follow-up with providers by attending scheduled visits and discussed goals to eat healthier, increase physical activity, exercise the brain, socialize more, get enough sleep and make time for laughter.  Next appointment: Please schedule your next Medicare Wellness Visit with your Nurse Health Advisor in 1 year by calling [phone].  Preventive Care 32 Years and Older, Male Preventive care refers to lifestyle choices and visits with your health care provider that can promote health and wellness. What does preventive care include? A yearly physical exam. This is also called an annual well check. Dental exams once or twice a year. Routine eye exams. Ask your health care provider how often you should have your eyes checked. Personal lifestyle choices, including: Daily care of your  teeth and gums. Regular physical activity. Eating a healthy diet. Avoiding tobacco and drug use. Limiting alcohol use. Practicing safe sex. Taking low doses of aspirin every day. Taking vitamin and mineral supplements as recommended by your health care provider. What happens during an annual well check? The services and screenings done by your health care provider during your annual well check will depend on your age, overall health, lifestyle risk factors, and family history of disease. Counseling  Your health care provider may ask you questions about your: Alcohol use. Tobacco use. Drug use. Emotional well-being. Home and relationship well-being. Sexual activity. Eating habits. History of falls. Memory and ability to understand (cognition). Work and work Statistician. Screening  You may have the following tests or measurements: Height, weight, and BMI. Blood pressure. Lipid and cholesterol levels. These may be checked every 5 years, or more frequently if you are over 41 years old. Skin check. Lung cancer screening. You may have this screening every year starting at age 38 if you have a 30-pack-year history of smoking and currently smoke or have quit within the past 15 years. Fecal occult blood test (FOBT) of the stool. You may have this test every year starting at age 40. Flexible sigmoidoscopy or colonoscopy. You may have a sigmoidoscopy every 5 years or a colonoscopy every 10 years starting at age 71. Prostate cancer screening. Recommendations will vary depending on your family history and other risks. Hepatitis C blood test. Hepatitis B blood test. Sexually transmitted disease (STD) testing. Diabetes screening. This is done by checking your blood sugar (glucose) after you have not eaten for a while (fasting). You may have this done every 1-3 years. Abdominal aortic aneurysm (AAA) screening. You may need this if you are a current or former  smoker. Osteoporosis. You may be  screened starting at age 52 if you are at high risk. Talk with your health care provider about your test results, treatment options, and if necessary, the need for more tests. Vaccines  Your health care provider may recommend certain vaccines, such as: Influenza vaccine. This is recommended every year. Tetanus, diphtheria, and acellular pertussis (Tdap, Td) vaccine. You may need a Td booster every 10 years. Zoster vaccine. You may need this after age 46. Pneumococcal 13-valent conjugate (PCV13) vaccine. One dose is recommended after age 21. Pneumococcal polysaccharide (PPSV23) vaccine. One dose is recommended after age 52. Talk to your health care provider about which screenings and vaccines you need and how often you need them. This information is not intended to replace advice given to you by your health care provider. Make sure you discuss any questions you have with your health care provider. Document Released: 12/02/2015 Document Revised: 07/25/2016 Document Reviewed: 09/06/2015 Elsevier Interactive Patient Education  2017 [employer] Prevention in the Home Falls can cause injuries. They can happen to people of all ages. There are many things you can do to make your home safe and to help prevent falls. What can I do on the outside of my home? Regularly fix the edges of walkways and driveways and fix any cracks. Remove anything that might make you trip as you walk through a door, such as a raised step or threshold. Trim any bushes or trees on the path to your home. Use bright outdoor lighting. Clear any walking paths of anything that might make someone trip, such as rocks or tools. Regularly check to see if handrails are loose or broken. Make sure that both sides of any steps have handrails. Any raised decks and porches should have guardrails on the edges. Have any leaves, snow, or ice cleared regularly. Use sand or salt on walking paths during winter. Clean up any spills in  your garage right away. This includes oil or grease spills. What can I do in the bathroom? Use night lights. Install grab bars by the toilet and in the tub and shower. Do not use towel bars as grab bars. Use non-skid mats or decals in the tub or shower. If you need to sit down in the shower, use a plastic, non-slip stool. Keep the floor dry. Clean up any water that spills on the floor as soon as it happens. Remove soap buildup in the tub or shower regularly. Attach bath mats securely with double-sided non-slip rug tape. Do not have throw rugs and other things on the floor that can make you trip. What can I do in the bedroom? Use night lights. Make sure that you have a light by your bed that is easy to reach. Do not use any sheets or blankets that are too big for your bed. They should not hang down onto the floor. Have a firm chair that has side arms. You can use this for support while you get dressed. Do not have throw rugs and other things on the floor that can make you trip. What can I do in the kitchen? Clean up any spills right away. Avoid walking on wet floors. Keep items that you use a lot in easy-to-reach places. If you need to reach something above you, use a strong step stool that has a grab bar. Keep electrical cords out of the way. Do not use floor polish or wax that makes floors slippery. If you must use wax, use non-skid  floor wax. Do not have throw rugs and other things on the floor that can make you trip. What can I do with my stairs? Do not leave any items on the stairs. Make sure that there are handrails on both sides of the stairs and use them. Fix handrails that are broken or loose. Make sure that handrails are as long as the stairways. Check any carpeting to make sure that it is firmly attached to the stairs. Fix any carpet that is loose or worn. Avoid having throw rugs at the top or bottom of the stairs. If you do have throw rugs, attach them to the floor with carpet  tape. Make sure that you have a light switch at the top of the stairs and the bottom of the stairs. If you do not have them, ask someone to add them for you. What else can I do to help prevent falls? Wear shoes that: Do not have high heels. Have rubber bottoms. Are comfortable and fit you well. Are closed at the toe. Do not wear sandals. If you use a stepladder: Make sure that it is fully opened. Do not climb a closed stepladder. Make sure that both sides of the stepladder are locked into place. Ask someone to hold it for you, if possible. Clearly mark and make sure that you can see: Any grab bars or handrails. First and last steps. Where the edge of each step is. Use tools that help you move around (mobility aids) if they are needed. These include: Canes. Walkers. Scooters. Crutches. Turn on the lights when you go into a dark area. Replace any light bulbs as soon as they burn out. Set up your furniture so you have a clear path. Avoid moving your furniture around. If any of your floors are uneven, fix them. If there are any pets around you, be aware of where they are. Review your medicines with your doctor. Some medicines can make you feel dizzy. This can increase your chance of falling. Ask your doctor what other things that you can do to help prevent falls. This information is not intended to replace advice given to you by your health care provider. Make sure you discuss any questions you have with your health care provider. Document Released: 09/01/2009 Document Revised: 04/12/2016 Document Reviewed: 12/10/2014 Elsevier Interactive Patient Education  2017 [employer].

## 2021-08-29 NOTE — Progress Notes (Signed)
Subjective:   Zachary Burke is a 70 y.o. male who presents for Medicare Annual/Subsequent preventive examination.  Review of Systems     Cardiac Risk Factors include: advanced age (>1men, >80 women);dyslipidemia;male gender     Objective:    Today's Vitals   08/29/21 0837 08/29/21 0853  BP:  122/80  Pulse:  63  Temp:  98.2 F (36.8 C)  SpO2:  97%  Weight:  181 lb 6.4 oz (82.3 kg)  Height:  6' (1.829 m)  PainSc: 0-No pain 0-No pain   Body mass index is 24.6 kg/m.  Advanced Directives 08/29/2021 02/16/2021 08/30/2018  Does Patient Have a Medical Advance Directive? No No No  Would patient like information on creating a medical advance directive? No - Patient declined - No - Patient declined    Current Medications (verified) Outpatient Encounter Medications as of 08/29/2021  Medication Sig   Cholecalciferol 50 MCG (2000 UT) TABS Take 1 tablet (2,000 Units total) by mouth daily.   HYDROcodone bit-homatropine (HYCODAN) 5-1.5 MG/5ML syrup Take 5 mLs by mouth every 8 (eight) hours as needed for cough.   psyllium (METAMUCIL) 58.6 % packet Take 1 packet by mouth daily.   tiZANidine (ZANAFLEX) 2 MG tablet Take 1 tablet (2 mg total) by mouth every 8 (eight) hours as needed for muscle spasms.   amoxicillin-clavulanate (AUGMENTIN) 600-42.9 MG/5ML suspension Take 5 mLs (600 mg total) by mouth with breakfast, with lunch, and with evening meal for 10 days. (Patient not taking: Reported on 08/29/2021)   No facility-administered encounter medications on file as of 08/29/2021.    Allergies (verified) Patient has no known allergies.   History: Past Medical History:  Diagnosis Date   Basal cell carcinoma    Colon polyps    2002,2006   GERD (gastroesophageal reflux disease)    Hiatal hernia    Past Surgical History:  Procedure Laterality Date   NASAL SEPTUM SURGERY     Family History  Problem Relation Age of Onset   Prostate cancer Paternal Uncle    Esophageal cancer  Paternal Grandfather    Colon polyps Mother    Colon polyps Father    Colon cancer Paternal Aunt    Social History   Socioeconomic History   Marital status: Married    Spouse name: Not on file   Number of children: 3   Years of education: Not on file   Highest education level: Not on file  Occupational History   Occupation: Scientist, clinical (histocompatibility and immunogenetics): [employer]  Tobacco Use   Smoking status: Never   Smokeless tobacco: Never  Substance and Sexual Activity   Alcohol use: No   Drug use: No   Sexual activity: Not on file  Other Topics Concern   Not on file  Social History Narrative   ** Merged History Encounter **       Social Determinants of Health   Financial Resource Strain: Low Risk    Difficulty of Paying Living Expenses: Not hard at all  Food Insecurity: No Food Insecurity   Worried About Charity fundraiser in the Last Year: Never true   Ran Out of Food in the Last Year: Never true  Transportation Needs: No Transportation Needs   Lack of Transportation (Medical): No   Lack of Transportation (Non-Medical): No  Physical Activity: Sufficiently Active   Days of Exercise per Week: 7 days   Minutes of Exercise per Session: 60 min  Stress: No Stress Concern Present  Feeling of Stress : Not at all  Social Connections: Socially Integrated   Frequency of Communication with Friends and Family: More than three times a week   Frequency of Social Gatherings with Friends and Family: More than three times a week   Attends Religious Services: More than 4 times per year   Active Member of [employer] or Organizations: Yes   Attends Music therapist: More than 4 times per year   Marital Status: Married    Tobacco Counseling Counseling given: Not Answered   Clinical Intake:  Pre-visit preparation completed: Yes  Pain : No/denies pain Pain Score: 0-No pain     BMI - recorded: 24.6 Nutritional Status: BMI of 19-24  Normal Nutritional Risks: None Diabetes:  No  How often do you need to have someone help you when you read instructions, pamphlets, or other written materials from your doctor or pharmacy?: 1 - Never What is the last grade level you completed in school?: Bachelor's Degree  Diabetic? no  Interpreter Needed?: No  Information entered by :: Zachary Abu, LPN   Activities of Daily Living In your present state of health, do you have any difficulty performing the following activities: 08/29/2021 07/04/2021  Hearing? N N  Vision? N N  Difficulty concentrating or making decisions? N N  Walking or climbing stairs? N N  Dressing or bathing? N N  Doing errands, shopping? N N  Preparing Food and eating ? N -  Using the Toilet? N -  In the past six months, have you accidently leaked urine? N -  Do you have problems with loss of bowel control? N -  Managing your Medications? N -  Managing your Finances? N -  Housekeeping or managing your Housekeeping? N -  Some recent data might be hidden    Patient Care Team: Janith Lima, MD as PCP - General (Internal Medicine)  Indicate any recent Medical Services you may have received from other than [facility] providers in the past year (date may be approximate).     Assessment:   This is a routine wellness examination for Zachary Burke.  Hearing/Vision screen No results found.  Dietary issues and exercise activities discussed: Current Exercise Habits: Home exercise routine, Time (Minutes): 60, Frequency (Times/Week): 7, Weekly Exercise (Minutes/Week): 420, Exercise limited by: None identified   Goals Addressed             This Visit's Progress    Patient Stated       To stay alive and healthy.      Depression Screen PHQ 2/9 Scores 08/23/2021 07/12/2020 05/08/2016 12/06/2014  PHQ - 2 Score 0 0 0 0    Fall Risk Fall Risk  08/29/2021 08/23/2021 07/12/2020 05/08/2016 12/06/2014  Falls in the past year? 0 0 0 No No  Number falls in past yr: 0 - 0 - -  Injury with Fall? 0 - 0 - -  Risk  for fall due to : No Fall Risks - No Fall Risks - -  Follow up Falls evaluation completed - Falls evaluation completed - -    FALL RISK PREVENTION PERTAINING TO THE HOME:  Any stairs in or around the home? No  If so, are there any without handrails? No  Home free of loose throw rugs in walkways, pet beds, electrical cords, etc? Yes  Adequate lighting in your home to reduce risk of falls? Yes   ASSISTIVE DEVICES UTILIZED TO PREVENT FALLS:  Life alert? No  Use of a cane, walker  or w/c? No  Grab bars in the bathroom? Yes  Shower chair or bench in shower? No  Elevated toilet seat or a handicapped toilet? No   TIMED UP AND GO:  Was the test performed? Yes .  Length of time to ambulate 10 feet: 6 sec.   Gait steady and fast without use of assistive device  Cognitive Function: Normal cognitive status assessed by direct observation by this Nurse Health Advisor. No abnormalities found.          Immunizations Immunization History  Administered Date(s) Administered   Influenza-Unspecified 07/26/2021   PFIZER(Purple Top)SARS-COV-2 Vaccination 12/08/2019, 12/29/2019, 08/28/2020   Pfizer Covid-19 Vaccine Bivalent Booster 66yrs & up 07/26/2021   Pneumococcal Conjugate-13 09/19/2017   Pneumococcal Polysaccharide-23 09/25/2018   Tdap 12/07/2014    TDAP status: Up to date  Flu Vaccine status: Up to date  Pneumococcal vaccine status: Up to date  Covid-19 vaccine status: Completed vaccines  Qualifies for Shingles Vaccine? Yes   Zostavax completed No   Shingrix Completed?: No.    Education has been provided regarding the importance of this vaccine. Patient has been advised to call insurance company to determine out of pocket expense if they have not yet received this vaccine. Advised may also receive vaccine at local pharmacy or Health Dept. Verbalized acceptance and understanding.  Screening Tests Health Maintenance  Topic Date Due   Hepatitis C Screening  Never done   Zoster  Vaccines- Shingrix (1 of 2) Never done   COVID-19 Vaccine (4 - Booster for Pfizer series) 12/29/2020   TETANUS/TDAP  12/07/2024   COLONOSCOPY (Pts 45-4yrs Insurance coverage will need to be confirmed)  12/15/2028   INFLUENZA VACCINE  Completed   HPV VACCINES  Aged Out    Health Maintenance  Health Maintenance Due  Topic Date Due   Hepatitis C Screening  Never done   Zoster Vaccines- Shingrix (1 of 2) Never done   COVID-19 Vaccine (4 - Booster for Pfizer series) 12/29/2020    Colorectal cancer screening: Type of screening: Colonoscopy. Completed 12/15/2018. Repeat every 10 years  Lung Cancer Screening: (Low Dose CT Chest recommended if Age 27-80 years, 30 pack-year currently smoking OR have quit w/in 15years.) does not qualify.   Lung Cancer Screening Referral: no  Additional Screening:  Hepatitis C Screening: does qualify; Completed yes  Vision Screening: Recommended annual ophthalmology exams for early detection of glaucoma and other disorders of the eye. Is the patient up to date with their annual eye exam?  Yes  Who is the provider or what is the name of the office in which the patient attends annual eye exams? [facility] If pt is not established with a provider, would they like to be referred to a provider to establish care? No .   Dental Screening: Recommended annual dental exams for proper oral hygiene  Community Resource Referral / Chronic Care Management: CRR required this visit?  No   CCM required this visit?  No      Plan:     I have personally reviewed and noted the following in the patient's chart:   Medical and social history Use of alcohol, tobacco or illicit drugs  Current medications and supplements including opioid prescriptions. Patient is not currently taking opioid prescriptions. Functional ability and status Nutritional status Physical activity Advanced directives List of other physicians Hospitalizations, surgeries, and ER  visits in previous 12 months Vitals Screenings to include cognitive, depression, and falls Referrals and appointments  In addition, I have reviewed and  discussed with patient certain preventive protocols, quality metrics, and best practice recommendations. A written personalized care plan for preventive services as well as general preventive health recommendations were provided to patient.     Sheral Flow, LPN   [id number]   Nurse Notes:  Hearing Screening - Comments:: Patient denied any hearing difficulty.   No hearing aids.  Vision Screening - Comments:: Patient wears readers.  Eye exam done annually by: [facility].

## 2021-08-31 ENCOUNTER — Encounter: Payer: Self-pay | Admitting: Internal Medicine

## 2021-09-07 ENCOUNTER — Encounter: Payer: Self-pay | Admitting: Internal Medicine

## 2021-09-07 ENCOUNTER — Telehealth (INDEPENDENT_AMBULATORY_CARE_PROVIDER_SITE_OTHER): Payer: Medicare HMO | Admitting: Internal Medicine

## 2021-09-07 DIAGNOSIS — J320 Chronic maxillary sinusitis: Secondary | ICD-10-CM

## 2021-09-07 DIAGNOSIS — R059 Cough, unspecified: Secondary | ICD-10-CM | POA: Diagnosis not present

## 2021-09-07 DIAGNOSIS — J329 Chronic sinusitis, unspecified: Secondary | ICD-10-CM | POA: Insufficient documentation

## 2021-09-07 MED ORDER — ALIGN 4 MG PO CAPS
1.0000 | ORAL_CAPSULE | Freq: Every day | ORAL | 1 refills | Status: AC
Start: 2021-09-07 — End: ?

## 2021-09-07 MED ORDER — LORATADINE 10 MG PO TABS: 10.0000 mg | ORAL_TABLET | Freq: Every day | ORAL | 1 refills | Status: DC

## 2021-09-07 MED ORDER — CEFDINIR 300 MG PO CAPS: 300.0000 mg | ORAL_CAPSULE | Freq: Two times a day (BID) | ORAL | 0 refills | Status: DC

## 2021-09-07 MED ORDER — PSEUDOEPHEDRINE HCL ER 120 MG PO TB12: 120.0000 mg | ORAL_TABLET | Freq: Every morning | ORAL | 1 refills | Status: DC

## 2021-09-07 MED ORDER — OXYMETAZOLINE HCL 0.05 % NA SOLN: 2.0000 | Freq: Every evening | NASAL | 0 refills | Status: DC

## 2021-09-07 NOTE — Progress Notes (Signed)
Virtual Visit via Video Note  I connected with Zachary Burke on 09/07/21 at  2:20 PM EDT by a video enabled telemedicine application and verified that I am speaking with the correct person using two identifiers.   I discussed the limitations of evaluation and management by telemedicine and the availability of in person appointments. The patient expressed understanding and agreed to proceed.  I was located at our [facility] office. The patient was at home. There was no one else present in the visit.  No chief complaint on file.    History of Present Illness: The patient is complaining of severe sinus congestion and drainage of 6 weeks duration.  He is having severe cough at night from drainage.  Lately he was unable to sleep in bed and have to sleep with the recliner.  He just finished a course of Augmentin with some relief, however, green mucus in volume is coming back.  He cannot use prednisone due to agitation side effect.  He has been using Zyrtec with no relief, using Flonase, nasal irrigation.  No fever.  No headache.  History of sinus surgery in 2006 due to refractory sinusitis Review of Systems  Constitutional:  Negative for chills, diaphoresis and fever.  HENT:  Positive for congestion and sinus pain. Negative for ear pain and sore throat.   Respiratory:  Positive for cough.   Gastrointestinal:  Negative for constipation and diarrhea.  Psychiatric/Behavioral:  The patient has insomnia.     Observations/Objective: The patient appears to be in no acute distress  Assessment and Plan:  Problem List Items Addressed This Visit     Chronic sinusitis    New.  Refractory - severe sinus congestion and drainage of 6 weeks duration.  He is having severe cough at night from drainage.  Lately he was unable to sleep in bed and have to sleep with the recliner.  He just finished a course of Augmentin with some relief, however, green mucus in volume is coming back.  He cannot use  prednisone due to agitation side effect.  He has been using Zyrtec with no relief, using Flonase, nasal irrigation. Start Sudafed at the morning, Afrin nasal spray at night.  Claritin daily.  Omnicef twice a day for 2 weeks.  Align probiotic Sinus CT, referral to ENT if not better The patient declined steroids      Relevant Medications   pseudoephedrine (SUDAFED) 120 MG 12 hr tablet   oxymetazoline (AFRIN) 0.05 % nasal spray   loratadine (CLARITIN) 10 MG tablet   cefdinir (OMNICEF) 300 MG capsule   Cough in adult patient    Continue Hycodan as needed Treat sinusitis, allergies        Meds ordered this encounter  Medications   pseudoephedrine (SUDAFED) 120 MG 12 hr tablet    Sig: Take 1 tablet (120 mg total) by mouth every morning.    Dispense:  20 tablet    Refill:  1   oxymetazoline (AFRIN) 0.05 % nasal spray    Sig: Place 2 sprays into both nostrils at bedtime. Use for 1-2 weeks    Dispense:  30 mL    Refill:  0   loratadine (CLARITIN) 10 MG tablet    Sig: Take 1 tablet (10 mg total) by mouth daily.    Dispense:  30 tablet    Refill:  1   cefdinir (OMNICEF) 300 MG capsule    Sig: Take 1 capsule (300 mg total) by mouth 2 (two) times daily.  Dispense:  28 capsule    Refill:  0   Probiotic Product (ALIGN) 4 MG CAPS    Sig: Take 1 capsule (4 mg total) by mouth daily.    Dispense:  30 capsule    Refill:  1     Follow Up Instructions:    I discussed the assessment and treatment plan with the patient. The patient was provided an opportunity to ask questions and all were answered. The patient agreed with the plan and demonstrated an understanding of the instructions.   The patient was advised to call back or seek an in-person evaluation if the symptoms worsen or if the condition fails to improve as anticipated.  I provided face-to-face time during this encounter. We were at different locations.   Walker Kehr, MD

## 2021-09-07 NOTE — Assessment & Plan Note (Signed)
Continue Hycodan as needed Treat sinusitis, allergies

## 2021-09-07 NOTE — Assessment & Plan Note (Signed)
New.  Refractory - severe sinus congestion and drainage of 6 weeks duration.  He is having severe cough at night from drainage.  Lately he was unable to sleep in bed and have to sleep with the recliner.  He just finished a course of Augmentin with some relief, however, green mucus in volume is coming back.  He cannot use prednisone due to agitation side effect.  He has been using Zyrtec with no relief, using Flonase, nasal irrigation. Start Sudafed at the morning, Afrin nasal spray at night.  Claritin daily.  Omnicef twice a day for 2 weeks.  Align probiotic Sinus CT, referral to ENT if not better The patient declined steroids

## 2021-09-12 ENCOUNTER — Other Ambulatory Visit: Payer: Self-pay | Admitting: Internal Medicine

## 2021-09-12 ENCOUNTER — Encounter: Payer: Self-pay | Admitting: Internal Medicine

## 2021-09-12 DIAGNOSIS — J320 Chronic maxillary sinusitis: Secondary | ICD-10-CM

## 2021-10-03 ENCOUNTER — Encounter: Payer: Self-pay | Admitting: Neurology

## 2021-10-03 ENCOUNTER — Ambulatory Visit: Payer: Medicare HMO | Admitting: Neurology

## 2021-10-03 VITALS — BP 105/68 | HR 62 | Ht 71.5 in | Wt 176.5 lb

## 2021-10-03 DIAGNOSIS — K219 Gastro-esophageal reflux disease without esophagitis: Secondary | ICD-10-CM

## 2021-10-03 DIAGNOSIS — J320 Chronic maxillary sinusitis: Secondary | ICD-10-CM | POA: Diagnosis not present

## 2021-10-03 DIAGNOSIS — R059 Cough, unspecified: Secondary | ICD-10-CM | POA: Diagnosis not present

## 2021-10-03 DIAGNOSIS — R351 Nocturia: Secondary | ICD-10-CM | POA: Diagnosis not present

## 2021-10-03 DIAGNOSIS — R0683 Snoring: Secondary | ICD-10-CM | POA: Diagnosis not present

## 2021-10-03 NOTE — Progress Notes (Signed)
SLEEP MEDICINE CLINIC    Provider:  Larey Seat, MD  Primary Care Physician:  Janith Lima, MD [name] [address]     Referring Provider: Janith Lima, [place],  [place] [address]          Chief Complaint according to patient   Patient presents with:     New Patient (Initial Visit)           HISTORY OF PRESENT ILLNESS:  Zachary Burke is a 70 y.o. Zachary Burke and  male patient seen here as a referral on 10/03/2021 from Dr. Ronnald Ramp.   Chief concern according to patient :    I have the pleasure of seeing Zachary Burke today, a right -handed Other or two or more races male with a possible sleep disorder.  He  has a past medical history of Basal cell carcinoma, Colon polyps, GERD (gastroesophageal reflux disease), and Hiatal hernia.    Sleep relevant medical history: Nocturia 3-4 times in 2 hours increments- sinusitis in autumn and spring, seasonal-    Family medical Rachelle Hora history: father and brother  on CPAP with OSA.   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 Family status is married, with 3 adult children, 4 grandchildren.  The patient currently works. Pets are not present. Tobacco use: none.  ETOH use : , Caffeine intake in form of Coffee(  1 cup in AM ) Soda( /) Tea ( /) or energy drinks. Regular exercise in form of walking.   Hobbies :long hikes      Sleep habits are as follows: The patient's dinner time is between  PM. The patient goes to bed at 9 PM and continues to sleep for intervals of 2 hours, wakes for 2-4 bathroom breaks, the first time at 2 AM.  He feels he is belching, gas, GERD affecting his sleep. He is using Tumms and omeprazole,  The preferred sleep position is supine, right lateral,  spooning with spouse. with the support of 1 pillow. Dreams are reportedly  vividly and frequent. Keeps a dream journal.    7 AM is the usual rise time. The patient wakes up spontaneously. He is always briefly up at 4 AM for a nocturnal bathroom break.   He reports heat intolerance.  some morning not feeling refreshed or restored in AM, with symptoms such as dry mouth, morning headaches, and residual fatigue.   Naps are taken daily lasting from  to 30- 60 minutes and are more refreshing.    Review of Systems: Out of a complete 14 system review, the patient complains of only the following symptoms, and all other reviewed systems are negative.:    Heat intolerance. Normal thyroid.  Fatigue, sleepiness , snoring, fragmented sleep, Nocturia, GERD,    How likely are you to doze in the following situations: 0 = not likely, 1 = slight chance, 2 = moderate chance, 3 = high chance   Sitting and Reading? Watching Television? Sitting inactive in a public place (theater or meeting)? As a passenger in a car for an hour without a break? Lying down in the afternoon when circumstances permit? Sitting and talking to someone? Sitting quietly after lunch without alcohol? In a car, while stopped for a few minutes in traffic?   Total = 03/ 24 points with daily nap-   FSS endorsed at 27/  63 points.   Social History   Socioeconomic History   Marital status: Married    Spouse name: Benjamine Mola   Number of children: 3   Years of education: Not on file   Highest education level: Not on file  Occupational History   Occupation: Scientist, clinical (histocompatibility and immunogenetics): [employer]  Tobacco Use   Smoking status: Never   Smokeless tobacco: Never  Substance and Sexual Activity   Alcohol use: No   Drug use: No   Sexual activity: Not on file  Other Topics Concern   Not on file  Social History Narrative   Lives with wife   Right handed   Caffeine: half a cup of coffee every morning   Social Determinants of Health   Financial Resource Strain: Low Risk    Difficulty of Paying Living Expenses: Not hard at all  Food Insecurity: No  Food Insecurity   Worried About Charity fundraiser in the Last Year: Never true   [place]ina in the Last Year: Never true  Transportation Needs: No Transportation Needs   Lack of Transportation (Medical): No   Lack of Transportation (Non-Medical): No  Physical Activity: Sufficiently Active   Days of Exercise per Week: 7 days   Minutes of Exercise per Session: 60 min  Stress: No Stress Concern Present   Feeling of Stress : Not at all  Social Connections: Socially Integrated   Frequency of Communication with Friends and Family: More than three times a week   Frequency of Social Gatherings with Friends and Family: More than three times a week   Attends Religious Services: More than 4 times per year   Active Member of [employer] or Organizations: Yes   Attends Music therapist: More than 4 times per year   Marital Status: Married    Family History  Problem Relation Age of Onset   COPD Mother    Colon cancer Paternal Aunt    Prostate cancer Paternal Uncle    Esophageal cancer Paternal Grandfather     Past Medical History:  Diagnosis Date   Basal cell carcinoma    Colon polyps    2002,2006   GERD (gastroesophageal reflux disease)    Hiatal hernia     Past Surgical History:  Procedure Laterality Date   HERNIA REPAIR  2019   NASAL SEPTUM SURGERY       Current Outpatient Medications on File Prior to Visit  Medication Sig Dispense Refill   Cholecalciferol 50 MCG (2000 UT) TABS Take 1 tablet (2,000 Units total) by mouth daily. 90 tablet 1   Probiotic Product (ALIGN) 4 MG CAPS Take 1 capsule (4 mg total) by mouth daily. 30 capsule 1   psyllium (METAMUCIL) 58.6 % packet Take 1 packet by mouth daily.     tiZANidine (ZANAFLEX) 2 MG tablet Take 1 tablet (2 mg total) by mouth every 8 (eight) hours as needed for muscle spasms. 90 tablet 0   No current facility-administered medications on file prior to visit.    No Known Allergies  Physical exam:  Today's Vitals    10/03/21 1013  BP: 105/68  Pulse: 62  Weight: 176 lb 8 oz (80.1 kg)  Height: 5' 11.5" (1.816 m)   Body mass index is 24.27 kg/m.   Wt Readings from Last 3 Encounters:  10/03/21 176 lb 8 oz (80.1 kg)  08/29/21 181 lb 6.4 oz (82.3 kg)  08/23/21 179 lb (81.2 kg)     Ht  Readings from Last 3 Encounters:  10/03/21 5' 11.5" (1.816 m)  08/29/21 6' (1.829 m)  08/23/21 5' 11.5" (1.816 m)      General: The patient is awake, alert and appears not in acute distress. The patient is well groomed. Head: Normocephalic, atraumatic. Neck is supple. Mallampati 1,  neck circumference:16 inches . Nasal airflow patent.  Retrognathia is  seen.  Nasal irrigation every day salt water.  Dental status: biological  Cardiovascular:  Regular rate and cardiac rhythm by pulse,  without distended neck veins. Respiratory: Lungs are clear to auscultation.  Skin:  Without evidence of ankle edema, or rash. Trunk: The patient's posture is erect.   Neurologic exam : The patient is awake and alert, oriented to place and time.   Memory subjective described as intact.  Attention span & concentration ability appears normal.  Speech is fluent,  with dysphonia .  Mood and affect are appropriate.   Cranial nerves: no loss of smell or taste reported  Pupils are equal and briskly reactive to light. Funduscopic exam deferred. .  Extraocular movements in vertical and horizontal planes were intact and without nystagmus. No Diplopia. Visual fields by finger perimetry are intact. Hearing was intact to soft voice and finger rubbing.    Facial sensation intact to fine touch.  Facial motor strength is symmetric and tongue and uvula move midline.  Neck ROM : rotation, tilt and flexion extension were normal for age and shoulder shrug was symmetrical.    Motor exam:  Symmetric bulk, tone and ROM.   Normal tone without cog wheeling, symmetric grip strength .   Sensory:  Fine touch, pinprick and vibration were tested  and   normal.  Proprioception tested in the upper extremities was normal.   Coordination: Rapid alternating movements in the fingers/hands were of normal speed.  The Finger-to-nose maneuver was intact without evidence of ataxia, dysmetria or tremor.   Gait and station: Patient could rise unassisted from a seated position, walked without assistive device.  Stance is of normal width/ base and the patient turned with 3 steps.  Toe and heel walk were deferred.  Deep tendon reflexes: in the  upper and lower extremities are symmetric and intact.  Babinski response was deferred      After spending a total time of  40  minutes face to face and additional time for physical and neurologic examination, review of laboratory studies,  personal review of imaging studies, reports and results of other testing and review of referral information / records as far as provided in visit, I have established the following assessments:  1) Mr. 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  Has a history of allergic rhinitis-sinusitis but has recently been preventing this with nasal rinses.  However there is certainly some anatomical risk factor left.  He takes naps every day but he when he cannot nap he does not feel excessively daytime sleepy either.  He reportedly is a loud snorer.  So a sleep apnea evaluation makes sense and it can be done by a home sleep test .  2) frequent nocturia also can relate to untreated sleep apnea.  3) there is another sleep quality impairment due to GERD, hiatal hernia and the patient has been doing better on Tums and Prilosec.  However there is still some gas or belching present at night. Acid reflux  can create apnea. He has a dry cough and dry mouth- this may contribute to it, also to the hoarseness of his voice.    My Plan is to proceed with:  1)  HST   I would like to thank Ronnald Ramp Arvid Right, MD and Janith Lima, [address] [place],  [place] [address] for allowing me to meet with and to take care of this pleasant patient.   I plan to follow up either personally or through our NP within 2-4 month.    Electronically signed by: Larey Seat, MD 10/03/2021 10:28 AM  [facility] Board certified by The [employer] of Sleep Medicine and Diplomate of the [employer] of Sleep Medicine. Board certified In Neurology through the [place], Fellow of the [employer] of Neurology. Medical Director of [facility].

## 2021-10-09 ENCOUNTER — Telehealth: Payer: Self-pay | Admitting: Neurology

## 2021-10-09 NOTE — Telephone Encounter (Signed)
Pt. Was called today to schedule his HST. Pt stated he did not want to be scheduled before the end of the year and requested to be scheduled after the first of the year.

## 2021-10-11 ENCOUNTER — Ambulatory Visit (INDEPENDENT_AMBULATORY_CARE_PROVIDER_SITE_OTHER): Payer: Medicare HMO | Admitting: Internal Medicine

## 2021-10-11 ENCOUNTER — Encounter: Payer: Self-pay | Admitting: Internal Medicine

## 2021-10-11 ENCOUNTER — Other Ambulatory Visit: Payer: Self-pay

## 2021-10-11 VITALS — BP 122/68 | HR 76 | Temp 97.7°F | Resp 16 | Ht 71.5 in | Wt 177.0 lb

## 2021-10-11 DIAGNOSIS — R030 Elevated blood-pressure reading, without diagnosis of hypertension: Secondary | ICD-10-CM | POA: Diagnosis not present

## 2021-10-11 DIAGNOSIS — Z23 Encounter for immunization: Secondary | ICD-10-CM

## 2021-10-11 DIAGNOSIS — N400 Enlarged prostate without lower urinary tract symptoms: Secondary | ICD-10-CM

## 2021-10-11 DIAGNOSIS — Z0000 Encounter for general adult medical examination without abnormal findings: Secondary | ICD-10-CM | POA: Diagnosis not present

## 2021-10-11 DIAGNOSIS — E785 Hyperlipidemia, unspecified: Secondary | ICD-10-CM | POA: Diagnosis not present

## 2021-10-11 LAB — URINALYSIS, ROUTINE W REFLEX MICROSCOPIC
Bilirubin Urine: NEGATIVE
Hgb urine dipstick: NEGATIVE
Ketones, ur: NEGATIVE
Leukocytes,Ua: NEGATIVE
Nitrite: NEGATIVE
RBC / HPF: NONE SEEN (ref 0–?)
Specific Gravity, Urine: 1.015 (ref 1.000–1.030)
Total Protein, Urine: NEGATIVE
Urine Glucose: NEGATIVE
Urobilinogen, UA: 0.2 (ref 0.0–1.0)
pH: 7 (ref 5.0–8.0)

## 2021-10-11 LAB — PSA: PSA: 1.49 ng/mL (ref 0.10–4.00)

## 2021-10-11 LAB — LIPID PANEL
Cholesterol: 134 mg/dL (ref 0–200)
HDL: 40.1 mg/dL (ref >39.00)
LDL Cholesterol: 77 mg/dL (ref 0–99)
NonHDL: 93.52
Total CHOL/HDL Ratio: 3
Triglycerides: 83 mg/dL (ref 0.0–149.0)
VLDL: 16.6 mg/dL (ref 0.0–40.0)

## 2021-10-11 LAB — HEPATIC FUNCTION PANEL
ALT: 15 U/L (ref 0–53)
AST: 23 U/L (ref 0–37)
Albumin: 4.4 g/dL (ref 3.5–5.2)
Alkaline Phosphatase: 74 U/L (ref 39–117)
Bilirubin, Direct: 0.2 mg/dL (ref 0.0–0.3)
Total Bilirubin: 0.8 mg/dL (ref 0.2–1.2)
Total Protein: 7.2 g/dL (ref 6.0–8.3)

## 2021-10-11 LAB — CORTISOL: Cortisol, Plasma: 10.2 ug/dL

## 2021-10-11 MED ORDER — SHINGRIX 50 MCG/0.5ML IM SUSR: 0.5000 mL | Freq: Once | INTRAMUSCULAR | 1 refills | Status: AC

## 2021-10-11 NOTE — Progress Notes (Signed)
Subjective:  Patient ID: Zachary Burke, male    DOB: 05/01/1951  Age: 70 y.o. MRN: [id number]  CC: Annual Exam  This visit occurred during the SARS-CoV-2 public health emergency.  Safety protocols were in place, including screening questions prior to the visit, additional usage of staff PPE, and extensive cleaning of exam room while observing appropriate contact time as indicated for disinfecting solutions.    HPI Zachary Burke presents for a CPX and f/up -   When I last saw him he was complaining of heat related to fatigue and exhaustion.  He tells me that now that the weather is cooler he feels well and offers no complaints.  He is very active and denies chest pain, shortness of breath, diaphoresis, dizziness, or lightheadedness.  He says his low back pain is improving.  Outpatient Medications Prior to Visit  Medication Sig Dispense Refill   Cholecalciferol 50 MCG (2000 UT) TABS Take 1 tablet (2,000 Units total) by mouth daily. 90 tablet 1   Probiotic Product (ALIGN) 4 MG CAPS Take 1 capsule (4 mg total) by mouth daily. 30 capsule 1   psyllium (METAMUCIL) 58.6 % packet Take 1 packet by mouth daily.     tiZANidine (ZANAFLEX) 2 MG tablet Take 1 tablet (2 mg total) by mouth every 8 (eight) hours as needed for muscle spasms. 90 tablet 0   No facility-administered medications prior to visit.    ROS Review of Systems  Constitutional:  Negative for diaphoresis, fatigue and unexpected weight change.  HENT: Negative.  Negative for trouble swallowing.   Eyes: Negative.   Respiratory:  Negative for cough, chest tightness, shortness of breath and wheezing.   Cardiovascular:  Negative for chest pain, palpitations and leg swelling.  Gastrointestinal:  Negative for abdominal pain, constipation, diarrhea, nausea and vomiting.  Endocrine: Negative.   Genitourinary: Negative.  Negative for difficulty urinating, hematuria, testicular pain and urgency.  Musculoskeletal:  Positive for back pain.  Negative for arthralgias.  Skin: Negative.   Neurological: Negative.  Negative for dizziness and weakness.  Hematological:  Negative for adenopathy. Does not bruise/bleed easily.  Psychiatric/Behavioral: Negative.     Objective:  BP 122/68 (BP Location: Left Arm, Patient Position: Sitting, Cuff Size: Normal)   Pulse 76   Temp 97.7 F (36.5 C) (Oral)   Resp 16   Ht 5' 11.5" (1.816 m)   Wt 177 lb (80.3 kg)   SpO2 98%   BMI 24.34 kg/m   BP Readings from Last 3 Encounters:  10/11/21 122/68  10/03/21 105/68  08/29/21 122/80    Wt Readings from Last 3 Encounters:  10/11/21 177 lb (80.3 kg)  10/03/21 176 lb 8 oz (80.1 kg)  08/29/21 181 lb 6.4 oz (82.3 kg)    Physical Exam Vitals reviewed.  Constitutional:      Appearance: Normal appearance.  HENT:     Nose: Nose normal.     Mouth/Throat:     Mouth: Mucous membranes are moist.  Eyes:     General: No scleral icterus.    Conjunctiva/sclera: Conjunctivae normal.  Cardiovascular:     Rate and Rhythm: Normal rate and regular rhythm.     Heart sounds: No murmur heard.   No gallop.  Pulmonary:     Effort: Pulmonary effort is normal.     Breath sounds: No stridor. No wheezing, rhonchi or rales.  Abdominal:     General: Abdomen is flat.     Palpations: There is no mass.     Tenderness: There  is no abdominal tenderness. There is no guarding.     Hernia: No hernia is present. There is no hernia in the left inguinal area or right inguinal area.  Genitourinary:    Pubic Area: No rash.      Penis: Normal and circumcised.      Testes: Normal.     Epididymis:     Right: Normal.     Left: Normal.     Prostate: Enlarged. Not tender and no nodules present.     Rectum: Guaiac result negative. External hemorrhoid and internal hemorrhoid present. No mass, tenderness or anal fissure. Normal anal tone.  Musculoskeletal:        General: Normal range of motion.     Cervical back: Neck supple.  Lymphadenopathy:     Cervical: No  cervical adenopathy.     Lower Body: No right inguinal adenopathy. No left inguinal adenopathy.  Skin:    General: Skin is warm and dry.  Neurological:     General: No focal deficit present.     Mental Status: He is alert. Mental status is at baseline.  Psychiatric:        Mood and Affect: Mood normal.    Lab Results  Component Value Date   WBC 5.1 07/04/2021   HGB 15.6 07/04/2021   HCT 46.6 07/04/2021   PLT 207.0 07/04/2021   GLUCOSE 86 07/04/2021   CHOL 134 10/11/2021   TRIG 83.0 10/11/2021   HDL 40.10 10/11/2021   LDLCALC 77 10/11/2021   ALT 15 10/11/2021   AST 23 10/11/2021   NA 137 07/04/2021   K 4.8 07/04/2021   CL 100 07/04/2021   CREATININE 1.02 07/04/2021   BUN 16 07/04/2021   CO2 31 07/04/2021   TSH 2.63 07/04/2021   PSA 1.49 10/11/2021   HGBA1C 5.5 07/12/2020    MR LUMBAR SPINE WO CONTRAST  Result Date: 11/09/2018 CLINICAL DATA:  Acute low back pain without sciatica EXAM: MRI LUMBAR SPINE WITHOUT CONTRAST TECHNIQUE: Multiplanar, multisequence MR imaging of the lumbar spine was performed. No intravenous contrast was administered. COMPARISON:  None. FINDINGS: Segmentation:  Normal Alignment:  Slight retrolisthesis L3-4. Mild anterolisthesis L4-5 Vertebrae:  Negative for fracture or mass. Conus medullaris and cauda equina: Conus extends to the L1-2 level. Conus and cauda equina appear normal. Paraspinal and other soft tissues: Negative for paraspinous mass or adenopathy Disc levels: L1-2: Negative L2-3: Normal disc space.  Mild facet degeneration without stenosis L3-4: Disc degeneration with disc bulging and endplate spurring. Moderate facet hypertrophy bilaterally. Moderate spinal stenosis. Moderate subarticular and foraminal stenosis bilaterally. L4-5: Mild anterolisthesis. Small left-sided disc protrusion with subarticular stenosis and possible impingement of the left L5 nerve root. Moderate to advanced facet hypertrophy bilaterally with mild spinal stenosis. Moderate  foraminal stenosis bilaterally L5-S1: Shallow central disc protrusion without significant neural impingement IMPRESSION: Moderate spinal stenosis L3-4 with moderate subarticular and foraminal stenosis bilaterally Small left-sided disc protrusion L4-5 with possible impingement left L5 nerve root. Mild spinal stenosis and moderate foraminal stenosis bilaterally Small central disc protrusion L5-S1 without neural impingement. Electronically Signed   By: Franchot Gallo M.D.   On: 11/09/2018 09:50    Assessment & Plan:   Millie was seen today for annual exam.  Diagnoses and all orders for this visit:  Benign prostatic hyperplasia without lower urinary tract symptoms- His PSA and UA are reassuring.  He has no symptoms that need to be treated. -     Urinalysis, Routine w reflex microscopic; Future -  PSA; Future -     PSA -     Urinalysis, Routine w reflex microscopic  Routine general medical examination at a health care facility- Exam completed, labs reviewed, vaccines reviewed and updated, cancer screenings are up-to-date, patient education was given.  Elevated systolic blood pressure reading without diagnosis of hypertension- His blood pressure is normal now.  Will check labs to screen for secondary causes. -     Aldosterone + renin activity w/ ratio; Future -     Cortisol; Future -     Cortisol -     Aldosterone + renin activity w/ ratio  Hyperlipidemia LDL goal <130- His ASCVD risk or is 14%.  Statin therapy is not indicated. -     Lipid panel; Future -     Hepatic function panel; Future -     Hepatic function panel -     Lipid panel  I am having Zachary Burke maintain his psyllium, Cholecalciferol, tiZANidine, and Align.  No orders of the defined types were placed in this encounter.    Follow-up: Return in about 6 months (around 04/10/2022).  Scarlette Calico, MD

## 2021-10-11 NOTE — Patient Instructions (Signed)

## 2021-10-21 LAB — ALDOSTERONE + RENIN ACTIVITY W/ RATIO
ALDO / PRA Ratio: 6.9 Ratio (ref 0.9–28.9)
Aldosterone: 7 ng/dL
Renin Activity: 1.01 ng/mL/h (ref 0.25–5.82)

## 2021-11-21 ENCOUNTER — Ambulatory Visit (INDEPENDENT_AMBULATORY_CARE_PROVIDER_SITE_OTHER): Payer: Medicare HMO | Admitting: Neurology

## 2021-11-21 DIAGNOSIS — R0683 Snoring: Secondary | ICD-10-CM

## 2021-11-21 DIAGNOSIS — G4733 Obstructive sleep apnea (adult) (pediatric): Secondary | ICD-10-CM

## 2021-11-21 DIAGNOSIS — R351 Nocturia: Secondary | ICD-10-CM

## 2021-11-21 DIAGNOSIS — J320 Chronic maxillary sinusitis: Secondary | ICD-10-CM

## 2021-11-21 DIAGNOSIS — K219 Gastro-esophageal reflux disease without esophagitis: Secondary | ICD-10-CM

## 2021-11-23 ENCOUNTER — Ambulatory Visit: Payer: BLUE CROSS/BLUE SHIELD | Admitting: Allergy & Immunology

## 2021-11-23 NOTE — Progress Notes (Signed)
[facility] TEST REPORT ( by Watch PAT)   STUDY DATE:  11-23-2021 ( Data load) DOB:  09-27-1951    ORDERING CLINICIAN: Dr. Scarlette Calico.  REFERRING CLINICIAN: Larey Seat, MD    CLINICAL INFORMATION/HISTORY: 10-03-2021-  71 year-old patient with snoring, chronic and allergic sinusitis, Nocturia and coughing.  Basal cell carcinoma, Colon polyps, GERD (gastroesophageal reflux disease), and Hiatal hernia. Dysphonia. Heat intolerance. Fatigue.  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  Has a history of allergic rhinitis-sinusitis but has recently been preventing this with nasal rinses.  However there is certainly some anatomical risk factor left.  He takes naps every day but he when he cannot nap he does not feel excessively daytime sleepy either.  He reportedly is a loud snorer.     Epworth sleepiness score: 3 /24. FSS at 27/ 63 points.    BMI: 23.9 kg/m   Neck Circumference: 16"   FINDINGS:   Sleep Summary:   Total Recording Time (hours, min):     Total recording time for this patient amounted to 7 hours and 56 minutes of which 7 hours 2 minutes for the estimated total sleep time.  The patient's REM sleep proportion was 26.4%                                  Respiratory Indices:   Calculated pAHI (per hour): Total apnea-hypopnea index was 13.4/h with a minor variability between REM and non-REM sleep.  REM sleep AHI was 17.3 and non-REM sleep AHI 12/h.    Positional AHI showed supine sleep to be associated with 18.7 apneas and hypopneas per hour.   Sleeping on the right or left side was associated with 0 apneas.  There were 88 minutes spent on the right side and 32 minutes of sleep on the left side.  Snoring level was average with a mean volume of 41 dB but accompanied only 7% of total  sleep time.                                                                     Oxygen Saturation Statistics:            O2 Saturation Range (%): The oxygen saturation during this home sleep test varied between and nadir at 85% and a maximum of 98% with a mean saturation at 94%.                                      O2 Saturation (minutes) <89%: 3.8 minutes          Pulse Rate Statistics:            Pulse Range:     Heart rate varied between 41 bpm and a maximum of 81 bpm with a mean heart rate of 52 bpm.  IMPRESSION:  This HST confirms the presence of mild obstructive sleep apnea REM sleep accentuated but not REM sleep dependent without significant hypoxia association.   The association that was strongest was positional apnea and this patient has no significant sleep apnea when not sleeping supine. Nearly all REM sleep was recorded in supine sleep position.   RECOMMENDATION: My first recommendation is to avoid supine sleep. There was not enough hypoxia or overall apnea to make CPAP mandatory.   However, treatment of apnea and snoring by CPAP is an option for this patient , please have him consider his allergic rhinitis /sinusitis tendencies which may not allow him to tolerate nasal CPAP as well.    An alternative to CPAP could be a dental device or an inspire device for this patient.  His BMI is low enough to qualify.  If the patient is willing to try CPAP, I would order an autotitration device for 6-16 cm water, 2 cm EPR and heated humidification, and with a mask of patient's comfort. Please order only after patient has indicated interest in CPAP.     INTERPRETING PHYSICIAN:   Larey Seat, MD   Medical Director of [facility].

## 2021-11-28 ENCOUNTER — Encounter: Payer: Self-pay | Admitting: Allergy & Immunology

## 2021-11-28 ENCOUNTER — Ambulatory Visit: Payer: Medicare HMO | Admitting: Allergy & Immunology

## 2021-11-28 ENCOUNTER — Other Ambulatory Visit: Payer: Self-pay

## 2021-11-28 VITALS — BP 120/68 | HR 82 | Temp 97.4°F | Resp 16 | Ht 71.5 in | Wt 171.6 lb

## 2021-11-28 DIAGNOSIS — K9049 Malabsorption due to intolerance, not elsewhere classified: Secondary | ICD-10-CM | POA: Diagnosis not present

## 2021-11-28 NOTE — Patient Instructions (Signed)
1. Food intolerance - Testing to peanuts, tree nuts, chocolate, milk, and grape were negative. - There is a the low positive predictive value of food allergy testing and hence the high possibility of false positives. - In contrast, food allergy testing has a high negative predictive value, therefore if testing is negative we can be relatively assured that they are indeed negative.  - I do not think that you need to worry about anaphylaxis. - This seems more like a GI issue.   2. Follow up as needed.   Please inform us of any Emergency Department visits, hospitalizations, or changes in symptoms. Call us before going to the ED for breathing or allergy symptoms since we might be able to fit you in for a sick visit. Feel free to contact us anytime with any questions, problems, or concerns.  It was a pleasure to meet you today!  Websites that have reliable patient information: 1. American Academy of Asthma, Allergy, and Immunology: www.aaaai.org 2. Food Allergy Research and Education (FARE): foodallergy.org 3. Mothers of Asthmatics: http://www.asthmacommunitynetwork.org 4. American College of Allergy, Asthma, and Immunology: www.acaai.org   COVID-19 Vaccine Information can be found at: ShippingScam.co.uk For questions related to vaccine distribution or appointments, please email [email or web address] or call [phone].   We realize that you might be concerned about having an allergic reaction to the COVID19 vaccines. To help with that concern, WE ARE OFFERING THE COVID19 VACCINES IN OUR OFFICE! Ask the front desk for dates!     Like [place] on [employer] and Instagram for our latest updates!      A healthy democracy works best when [employer] participate! Make sure you are registered to vote! If you have moved or changed any of your contact information, you will need to get this updated before voting!  In some cases, you MAY be  able to register to vote online: [email or web address]      Food Adult Perc - 11/28/21 1400     Time Antigen Placed [place]    Location Arm    Number of allergen test 17     Control-buffer 50% Glycerol Negative    Control-Histamine 1 mg/ml 2+    1. Peanut Negative    5. Milk, cow Negative    7. Casein Negative    10. Cashew Negative    11. Pecan Food Negative    12. Ailey Negative    13. Almond Negative    14. Hazelnut Negative    15. [place] nut Negative    16. Coconut Negative    17. Pistachio Negative    55. Grape (White seedless) Negative    64. Chocolate/Cacao bean Negative

## 2021-11-28 NOTE — Progress Notes (Signed)
NEW PATIENT  Date of Service/Encounter:  11/28/21  Consult requested by: Janith Lima, MD   Assessment:   Food intolerance - with negative testing to selected foods today  GERD  Plan/Recommendations:   1. Food intolerance - Testing to peanuts, tree nuts, chocolate, milk, and grape were negative. - There is a the low positive predictive value of food allergy testing and hence the high possibility of false positives. - In contrast, food allergy testing has a high negative predictive value, therefore if testing is negative we can be relatively assured that they are indeed negative.  - I do not think that you need to worry about anaphylaxis. - This seems more like a GI issue.   2. Follow up as needed.   This note in its entirety was forwarded to the Provider who requested this consultation.\  Subjective:   Minor Iden is a 71 y.o. male presenting today for evaluation of  Chief Complaint  Patient presents with   Sinusitis    Jt Brabec has a history of the following: Patient Active Problem List   Diagnosis Date Noted   Chronic sinusitis 09/07/2021   Chronic bilateral low back pain without sciatica 01/31/2021   Vitamin D deficiency disease 07/13/2020   Erectile dysfunction due to arterial insufficiency 07/12/2020   Routine general medical examination at a health care facility 07/12/2020   Benign prostatic hyperplasia without lower urinary tract symptoms 07/12/2020   Hyperlipidemia LDL goal <130 07/12/2020   PUD (peptic ulcer disease) 07/12/2020   Elevated systolic blood pressure reading without diagnosis of hypertension 07/12/2020   Esophageal reflux 08/21/2010   Personal history of colonic polyps 08/21/2010    History obtained from: chart review and patient.  Captain Blucher was referred by Janith Lima, MD.     Trent is a 71 y.o. male presenting for an evaluation of possible food allergies .   Back in 2006, he had a re-mount of his sinuses. He  had a deviated septum repaired. He had a rotor rooter done and had a bunch of fungus balls. He now has a sinus infection in the spring and the fall. The new development is that he has become "intolerant of heat" and he just about falls out when he is got.   He has started Gatorade and has been eating trail mix which seems to be working and keeping him sustained. But now he has GERD and has had to wait for 20-30 minutes when he goes to sleep. His question is whether he is allergic to tree nuts or peanuts or not at all. He does have a hiatal hernia that was diagnosed back around 40 years ago or so.   He has no hives or throat swelling from the peanuts and tree nuts. He only has the GERD. Six weeks later, he had no GERD. He has a peanut butter sandwich today without a problem. He also was taking care of his granddaughter last week and had several trail mixes. GERD presented the next week. He does not take medications on a daily basis; he uses Tums as needed. Omeprazole was recommended. He had two weeks of omeprazole in November.   The ingredients in the trail mix are listed below.  He seems to tolerate each of these individually.    He is a Physiological scientist and works outdoors. He also turns wooden bowls as a hobby on his lathe. He has not had any kind of reactions from this. He reports that he does not have any  environmental allergy symptoms.  He denies any sinus pain or pressure, sneezing, rhinorrhea, or other allergic rhinitis symptoms.  He gets sinus infections around twice per year.  Otherwise, he has no current infections.  Otherwise, there is no history of other atopic diseases, including asthma, food allergies, drug allergies, stinging insect allergies, eczema, urticaria, or contact dermatitis. There is no significant infectious history. Vaccinations are up to date.    Past Medical History: Patient Active Problem List   Diagnosis Date Noted   Chronic sinusitis 09/07/2021   Chronic bilateral low back  pain without sciatica 01/31/2021   Vitamin D deficiency disease 07/13/2020   Erectile dysfunction due to arterial insufficiency 07/12/2020   Routine general medical examination at a health care facility 07/12/2020   Benign prostatic hyperplasia without lower urinary tract symptoms 07/12/2020   Hyperlipidemia LDL goal <130 07/12/2020   PUD (peptic ulcer disease) 07/12/2020   Elevated systolic blood pressure reading without diagnosis of hypertension 07/12/2020   Esophageal reflux 08/21/2010   Personal history of colonic polyps 08/21/2010    Medication List:  Allergies as of 11/28/2021   No Known Allergies      Medication List        Accurate as of November 28, 2021  3:22 PM. If you have any questions, ask your nurse or doctor.          Align 4 MG Caps Take 1 capsule (4 mg total) by mouth daily.   Cholecalciferol 50 MCG (2000 UT) Tabs Take 1 tablet (2,000 Units total) by mouth daily.   psyllium 58.6 % packet Commonly known as: METAMUCIL Take 1 packet by mouth daily.   tiZANidine 2 MG tablet Commonly known as: ZANAFLEX Take 1 tablet (2 mg total) by mouth every 8 (eight) hours as needed for muscle spasms.        Birth History: non-contributory  Developmental History: non-contributory  Past Surgical History: Past Surgical History:  Procedure Laterality Date   HERNIA REPAIR  2019   NASAL SEPTUM SURGERY       Family History: Family History  Problem Relation Age of Onset   COPD Mother    Colon cancer Paternal Aunt    Prostate cancer Paternal Uncle    Esophageal cancer Paternal Grandfather      Social History: Tremayne lives at home with his wife.  He is a Chief Financial Officer.  He lives in a house that was built in 1979.  There are hardwoods throughout the home.  He has a heat pump for heating and cooling.  There are no indoor or outdoor animals.  There are dust mite covers on the bedding.  There is no tobacco exposure.  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  He does not use a HEPA filter.  He does not live near an interstate or industrial area.   Review of Systems  Constitutional: Negative.  Negative for fever, malaise/fatigue and weight loss.  HENT: Negative.  Negative for congestion, ear discharge, ear pain and sinus pain.   Eyes:  Negative for pain, discharge and redness.  Respiratory:  Positive for cough. Negative for sputum production, shortness of breath and wheezing.   Cardiovascular: Negative.  Negative for chest pain and palpitations.  Gastrointestinal:  Positive for heartburn. Negative for abdominal pain, nausea and vomiting.  Skin: Negative.  Negative for itching and rash.  Neurological:  Negative for dizziness and headaches.  Endo/Heme/Allergies:  Negative for environmental allergies. Does not bruise/bleed easily.  Objective:   Blood pressure 120/68, pulse 82, temperature (!) 97.4 F (36.3 C), temperature source Temporal, resp. rate 16, height 5' 11.5" (1.816 m), weight 171 lb 9.6 oz (77.8 kg), SpO2 97 %. Body mass index is 23.6 kg/m.   Physical Exam:   Physical Exam Vitals reviewed.  Constitutional:      Appearance: He is well-developed.     Comments: Very pleasant male.  Cooperative with the exam. Very ebullient.   HENT:     Head: Normocephalic and atraumatic.     Right Ear: Tympanic membrane, ear canal and external ear normal. No drainage, swelling or tenderness. Tympanic membrane is not injected, scarred, erythematous, retracted or bulging.     Left Ear: Tympanic membrane, ear canal and external ear normal. No drainage, swelling or tenderness. Tympanic membrane is not injected, scarred, erythematous, retracted or bulging.     Nose: No nasal deformity, septal deviation, mucosal edema or rhinorrhea.     Right Turbinates: Enlarged, swollen and pale.     Left Turbinates: Enlarged, swollen and pale.     Right Sinus: No maxillary sinus tenderness or frontal sinus tenderness.      Left Sinus: No maxillary sinus tenderness or frontal sinus tenderness.     Mouth/Throat:     Mouth: Mucous membranes are not pale and not dry.     Pharynx: Uvula midline.  Eyes:     General:        Right eye: No discharge.        Left eye: No discharge.     Conjunctiva/sclera: Conjunctivae normal.     Right eye: Right conjunctiva is not injected. No chemosis.    Left eye: Left conjunctiva is not injected. No chemosis.    Pupils: Pupils are equal, round, and reactive to light.  Cardiovascular:     Rate and Rhythm: Normal rate and regular rhythm.     Heart sounds: Normal heart sounds.  Pulmonary:     Effort: Pulmonary effort is normal. No tachypnea, accessory muscle usage or respiratory distress.     Breath sounds: Normal breath sounds. No wheezing, rhonchi or rales.     Comments: Moving air well in all lung fields.  No increased work of breathing. Chest:     Chest wall: No tenderness.  Abdominal:     Tenderness: There is no abdominal tenderness. There is no guarding or rebound.  Lymphadenopathy:     Head:     Right side of head: No submandibular, tonsillar or occipital adenopathy.     Left side of head: No submandibular, tonsillar or occipital adenopathy.     Cervical: No cervical adenopathy.  Skin:    General: Skin is warm.     Capillary Refill: Capillary refill takes less than 2 seconds.     Coloration: Skin is not pale.     Findings: No abrasion, erythema, petechiae or rash. Rash is not papular, urticarial or vesicular.     Comments: No eczematous or urticarial lesions noted.  Neurological:     Mental Status: He is alert.  Psychiatric:        Behavior: Behavior is cooperative.     Diagnostic studies:   Allergy Studies:     Food Adult Perc - 11/28/21 1400     Time Antigen Placed 1447    Allergen Manufacturer Lavella Hammock    Location Arm    Number of allergen test 17     Control-buffer 50% Glycerol Negative    Control-Histamine 1 mg/ml 2+  1. Peanut Negative    5.  Milk, cow Negative    7. Casein Negative    8. Shellfish Mix Negative    9. Fish Mix Negative    10. Cashew Negative    11. Pecan Food Negative    12. [place] Negative    13. Almond Negative    14. Hazelnut Negative    15. [place] nut Negative    16. Coconut Negative    17. Pistachio Negative    55. Grape (White seedless) Negative    64. Chocolate/Cacao bean Negative             Allergy testing results were read and interpreted by myself, documented by clinical staff.         Salvatore Marvel, MD [facility] [place] [facility] [place]

## 2021-11-30 DIAGNOSIS — N401 Enlarged prostate with lower urinary tract symptoms: Secondary | ICD-10-CM | POA: Insufficient documentation

## 2021-11-30 DIAGNOSIS — G4733 Obstructive sleep apnea (adult) (pediatric): Secondary | ICD-10-CM | POA: Insufficient documentation

## 2021-11-30 DIAGNOSIS — R351 Nocturia: Secondary | ICD-10-CM | POA: Insufficient documentation

## 2021-11-30 DIAGNOSIS — R0683 Snoring: Secondary | ICD-10-CM | POA: Insufficient documentation

## 2021-11-30 NOTE — Procedures (Signed)
[facility] TEST REPORT ( by Watch PAT)   STUDY DATE:  11-23-2021 ( Data load) DOB:  10/31/1951    ORDERING CLINICIAN: Dr. Scarlette Calico.  REFERRING CLINICIAN: Larey Seat, MD    CLINICAL INFORMATION/HISTORY: 10-03-2021-  71 year-old patient with snoring, chronic and allergic sinusitis, Nocturia and coughing.  Basal cell carcinoma, Colon polyps, GERD (gastroesophageal reflux disease), and Hiatal hernia. Dysphonia. Heat intolerance. Fatigue.  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  Has a history of allergic rhinitis-sinusitis but has recently been preventing this with nasal rinses.  However there is certainly some anatomical risk factor left.  He takes naps every day but he when he cannot nap he does not feel excessively daytime sleepy either.  He reportedly is a loud snorer.     Epworth sleepiness score: 3 /24. FSS at 27/ 63 points.    BMI: 23.9 kg/m   Neck Circumference: 16"   FINDINGS:   Sleep Summary:   Total Recording Time (hours, min):     Total recording time for this patient amounted to 7 hours and 56 minutes of which 7 hours 2 minutes for the estimated total sleep time.  The patient's REM sleep proportion was 26.4%                                  Respiratory Indices:   Calculated pAHI (per hour): Total apnea-hypopnea index was 13.4/h with a minor variability between REM and non-REM sleep.  REM sleep AHI was 17.3 and non-REM sleep AHI 12/h.    Positional AHI showed supine sleep to be associated with 18.7 apneas and hypopneas per hour.   Sleeping on the right or left side was associated with 0 apneas.  There were 88 minutes spent on the right side and 32 minutes of sleep on the left side.  Snoring level was average with a mean volume of 41 dB but accompanied only 7% of total sleep time.                                                                      Oxygen Saturation Statistics:            O2 Saturation Range (%): The oxygen saturation during this home sleep test varied between and nadir at 85% and a maximum of 98% with a mean saturation at 94%.                                      O2 Saturation (minutes) <89%: 3.8 minutes          Pulse Rate Statistics:            Pulse Range:     Heart rate varied between 41 bpm and a maximum of 81 bpm with a mean heart rate of 52 bpm.            IMPRESSION:  This HST confirms  the presence of mild obstructive sleep apnea REM sleep accentuated but not REM sleep dependent without significant hypoxia association.   The association that was strongest was positional apnea and this patient has no significant sleep apnea when not sleeping supine. Nearly all REM sleep was recorded in supine sleep position.   RECOMMENDATION:  My first recommendation is to avoid supine sleep. There was not enough hypoxia or overall apnea to make CPAP mandatory.    However, treatment of apnea and snoring by CPAP is an option for this patient , please have him consider his allergic rhinitis /sinusitis tendencies which may not allow him to tolerate nasal CPAP as well.    An alternative to CPAP could be a dental device or an inspire device for this patient.  His BMI is low enough to qualify.  If the patient is willing to try CPAP, I would order an autotitration device for 6-16 cm water, 2 cm EPR and heated humidification, and with a mask of patient's comfort. Please order only after patient has indicated interest in CPAP.  We can refer to sleep dentist or inspire if interested in these options.     INTERPRETING PHYSICIAN:   Larey Seat, MD   Medical Director of [facility].

## 2021-11-30 NOTE — Progress Notes (Signed)
IMPRESSION:  This HST confirms the presence of mild obstructive sleep apnea REM sleep accentuated but not REM sleep dependent without significant hypoxia association.   The association that was strongest was positional apnea and this patient has no significant sleep apnea when not sleeping supine. Nearly all REM sleep was recorded in supine sleep position.  RECOMMENDATION:  My first recommendation is to avoid supine sleep. There was not enough hypoxia or overall apnea to make CPAP mandatory.   However, treatment of apnea and snoring by CPAP is an option for this patient , please have him consider his allergic rhinitis /sinusitis tendencies which may not allow him to tolerate nasal CPAP as well.    An alternative to CPAP could be a dental device or an inspire device for this patient.  His BMI is low enough to qualify.  If the patient is willing to try CPAP, I would order an autotitration device for 6-16 cm water, 2 cm EPR and heated humidification, and with a mask of patient's comfort. Please order only after patient has indicated interest in CPAP.  We can refer to sleep dentist or inspire if interested in these options.

## 2021-12-01 DIAGNOSIS — B353 Tinea pedis: Secondary | ICD-10-CM | POA: Diagnosis not present

## 2021-12-01 DIAGNOSIS — B351 Tinea unguium: Secondary | ICD-10-CM | POA: Diagnosis not present

## 2021-12-01 DIAGNOSIS — D485 Neoplasm of uncertain behavior of skin: Secondary | ICD-10-CM | POA: Diagnosis not present

## 2021-12-01 DIAGNOSIS — D1801 Hemangioma of skin and subcutaneous tissue: Secondary | ICD-10-CM | POA: Diagnosis not present

## 2021-12-01 DIAGNOSIS — L918 Other hypertrophic disorders of the skin: Secondary | ICD-10-CM | POA: Diagnosis not present

## 2021-12-01 DIAGNOSIS — C4441 Basal cell carcinoma of skin of scalp and neck: Secondary | ICD-10-CM | POA: Diagnosis not present

## 2021-12-01 DIAGNOSIS — Z85828 Personal history of other malignant neoplasm of skin: Secondary | ICD-10-CM | POA: Diagnosis not present

## 2021-12-01 DIAGNOSIS — L218 Other seborrheic dermatitis: Secondary | ICD-10-CM | POA: Diagnosis not present

## 2021-12-01 DIAGNOSIS — L821 Other seborrheic keratosis: Secondary | ICD-10-CM | POA: Diagnosis not present

## 2021-12-01 DIAGNOSIS — L570 Actinic keratosis: Secondary | ICD-10-CM | POA: Diagnosis not present

## 2021-12-04 ENCOUNTER — Telehealth: Payer: Self-pay | Admitting: *Deleted

## 2021-12-04 NOTE — Telephone Encounter (Signed)
-----   Message from Larey Seat, MD sent at 11/30/2021  5:46 PM EST ----- IMPRESSION:  This HST confirms the presence of mild obstructive sleep apnea REM sleep accentuated but not REM sleep dependent without significant hypoxia association.   The association that was strongest was positional apnea and this patient has no significant sleep apnea when not sleeping supine. Nearly all REM sleep was recorded in supine sleep position.  RECOMMENDATION:  My first recommendation is to avoid supine sleep. There was not enough hypoxia or overall apnea to make CPAP mandatory.    However, treatment of apnea and snoring by CPAP is an option for this patient , please have him consider his allergic rhinitis /sinusitis tendencies which may not allow him to tolerate nasal CPAP as well.    An alternative to CPAP could be a dental device or an inspire device for this patient.  His BMI is low enough to qualify.  If the patient is willing to try CPAP, I would order an autotitration device for 6-16 cm water, 2 cm EPR and heated humidification, and with a mask of patient's comfort. Please order only after patient has indicated interest in CPAP.  We can refer to sleep dentist or inspire if interested in these options.

## 2021-12-04 NOTE — Telephone Encounter (Signed)
Called and spoke with pt. He received results in mychart over the weekend and already changed to sleeping on side/avoiding supine sleep. This has helped tremendously. He will continue this. Does not want to start on cpap at this time. He will call back if he has any new or worsening sx moving forward.

## 2022-02-21 ENCOUNTER — Encounter: Payer: Self-pay | Admitting: Internal Medicine

## 2022-02-28 ENCOUNTER — Encounter: Payer: Self-pay | Admitting: Internal Medicine

## 2022-02-28 ENCOUNTER — Ambulatory Visit (INDEPENDENT_AMBULATORY_CARE_PROVIDER_SITE_OTHER): Payer: Medicare HMO | Admitting: Internal Medicine

## 2022-02-28 VITALS — BP 122/74 | HR 57 | Temp 97.6°F | Ht 71.5 in | Wt 171.0 lb

## 2022-02-28 DIAGNOSIS — J301 Allergic rhinitis due to pollen: Secondary | ICD-10-CM

## 2022-02-28 DIAGNOSIS — H6993 Unspecified Eustachian tube disorder, bilateral: Secondary | ICD-10-CM | POA: Diagnosis not present

## 2022-02-28 MED ORDER — METHYLPREDNISOLONE 4 MG PO TBPK: ORAL_TABLET | ORAL | 0 refills | Status: AC

## 2022-02-28 MED ORDER — MONTELUKAST SODIUM 10 MG PO TABS: 10.0000 mg | ORAL_TABLET | Freq: Every day | ORAL | 1 refills | Status: DC

## 2022-02-28 NOTE — Progress Notes (Signed)
? ?Subjective:  ?Patient ID: Zachary Burke, male    DOB: 1951/10/18  Age: 71 y.o. MRN: [id number] ? ?CC: Ear Fullness ? ? ?HPI ?Zachary Burke presents for f/up - ? ?He complains of a 6-week history of pain and popping in both ears with chronic sinus symptoms.  He uses a Nettie pot every day.  He has not gotten much symptom relief with Flonase.  His hearing has been normal. ? ?Outpatient Medications Prior to Visit  ?Medication Sig Dispense Refill  ? Cholecalciferol 50 MCG (2000 UT) TABS Take 1 tablet (2,000 Units total) by mouth daily. 90 tablet 1  ? Probiotic Product (ALIGN) 4 MG CAPS Take 1 capsule (4 mg total) by mouth daily. 30 capsule 1  ? psyllium (METAMUCIL) 58.6 % packet Take 1 packet by mouth daily.    ? tiZANidine (ZANAFLEX) 2 MG tablet Take 1 tablet (2 mg total) by mouth every 8 (eight) hours as needed for muscle spasms. 90 tablet 0  ? ?No facility-administered medications prior to visit.  ? ? ?ROS ?Review of Systems  ?Constitutional: Negative.   ?HENT:  Positive for congestion, ear pain, postnasal drip and rhinorrhea. Negative for nosebleeds, sinus pressure, sinus pain, sneezing, sore throat and tinnitus.   ?Eyes: Negative.   ?Respiratory: Negative.  Negative for cough, shortness of breath and wheezing.   ?Cardiovascular:  Negative for chest pain, palpitations and leg swelling.  ?Gastrointestinal:  Negative for abdominal pain and diarrhea.  ?Endocrine: Negative.   ?Genitourinary: Negative.  Negative for difficulty urinating.  ?Musculoskeletal: Negative.   ?Skin: Negative.   ?Neurological:  Negative for dizziness, weakness, light-headedness, numbness and headaches.  ?Hematological:  Negative for adenopathy. Does not bruise/bleed easily.  ?Psychiatric/Behavioral: Negative.    ? ?Objective:  ?BP 122/74 (BP Location: Right Arm, Patient Position: Sitting, Cuff Size: Large)   Pulse (!) 57   Temp 97.6 ?F (36.4 ?C) (Oral)   Ht 5' 11.5" (1.816 m)   Wt 171 lb (77.6 kg)   SpO2 97%   BMI 23.52 kg/m?  ? ?BP  Readings from Last 3 Encounters:  ?02/28/22 122/74  ?11/28/21 120/68  ?10/11/21 122/68  ? ? ?Wt Readings from Last 3 Encounters:  ?02/28/22 171 lb (77.6 kg)  ?11/28/21 171 lb 9.6 oz (77.8 kg)  ?10/11/21 177 lb (80.3 kg)  ? ? ?Physical Exam ?Vitals reviewed.  ?HENT:  ?   Right Ear: Hearing, ear canal and external ear normal. No middle ear effusion. There is no impacted cerumen. Tympanic membrane is not injected.  ?   Left Ear: Hearing, tympanic membrane, ear canal and external ear normal.  No middle ear effusion. There is no impacted cerumen. Tympanic membrane is not injected.  ?   Nose: Nose normal.  ?   Right Nostril: No epistaxis.  ?   Left Nostril: No epistaxis.  ?   Right Sinus: No maxillary sinus tenderness or frontal sinus tenderness.  ?   Left Sinus: No maxillary sinus tenderness or frontal sinus tenderness.  ?   Mouth/Throat:  ?   Mouth: Mucous membranes are moist.  ?Eyes:  ?   General: No scleral icterus. ?   Conjunctiva/sclera: Conjunctivae normal.  ?Cardiovascular:  ?   Rate and Rhythm: Normal rate and regular rhythm.  ?   Heart sounds: No murmur heard. ?Pulmonary:  ?   Effort: Pulmonary effort is normal.  ?   Breath sounds: No stridor. No wheezing, rhonchi or rales.  ?Abdominal:  ?   General: Abdomen is flat.  ?  Palpations: There is no mass.  ?   Tenderness: There is no abdominal tenderness. There is no guarding.  ?   Hernia: No hernia is present.  ?Musculoskeletal:     ?   General: Normal range of motion.  ?   Cervical back: Neck supple.  ?Lymphadenopathy:  ?   Cervical: No cervical adenopathy.  ?Skin: ?   General: Skin is warm.  ?   Findings: No rash.  ?Neurological:  ?   General: No focal deficit present.  ?   Mental Status: He is alert.  ? ? ?Lab Results  ?Component Value Date  ? WBC 5.1 07/04/2021  ? HGB 15.6 07/04/2021  ? HCT 46.6 07/04/2021  ? PLT 207.0 07/04/2021  ? GLUCOSE 86 07/04/2021  ? CHOL 134 10/11/2021  ? TRIG 83.0 10/11/2021  ? HDL 40.10 10/11/2021  ? Ambia 77 10/11/2021  ? ALT 15  10/11/2021  ? AST 23 10/11/2021  ? NA 137 07/04/2021  ? K 4.8 07/04/2021  ? CL 100 07/04/2021  ? CREATININE 1.02 07/04/2021  ? BUN 16 07/04/2021  ? CO2 31 07/04/2021  ? TSH 2.63 07/04/2021  ? PSA 1.49 10/11/2021  ? HGBA1C 5.5 07/12/2020  ? ? ?MR LUMBAR SPINE WO CONTRAST ? ?Result Date: 11/09/2018 ?CLINICAL DATA:  Acute low back pain without sciatica EXAM: MRI LUMBAR SPINE WITHOUT CONTRAST TECHNIQUE: Multiplanar, multisequence MR imaging of the lumbar spine was performed. No intravenous contrast was administered. COMPARISON:  None. FINDINGS: Segmentation:  Normal Alignment:  Slight retrolisthesis L3-4. Mild anterolisthesis L4-5 Vertebrae:  Negative for fracture or mass. Conus medullaris and cauda equina: Conus extends to the L1-2 level. Conus and cauda equina appear normal. Paraspinal and other soft tissues: Negative for paraspinous mass or adenopathy Disc levels: L1-2: Negative L2-3: Normal disc space.  Mild facet degeneration without stenosis L3-4: Disc degeneration with disc bulging and endplate spurring. Moderate facet hypertrophy bilaterally. Moderate spinal stenosis. Moderate subarticular and foraminal stenosis bilaterally. L4-5: Mild anterolisthesis. Small left-sided disc protrusion with subarticular stenosis and possible impingement of the left L5 nerve root. Moderate to advanced facet hypertrophy bilaterally with mild spinal stenosis. Moderate foraminal stenosis bilaterally L5-S1: Shallow central disc protrusion without significant neural impingement IMPRESSION: Moderate spinal stenosis L3-4 with moderate subarticular and foraminal stenosis bilaterally Small left-sided disc protrusion L4-5 with possible impingement left L5 nerve root. Mild spinal stenosis and moderate foraminal stenosis bilaterally Small central disc protrusion L5-S1 without neural impingement. Electronically Signed   By: Franchot Gallo M.D.   On: 11/09/2018 09:50  ? ? ?Assessment & Plan:  ? ?Zachary Burke was seen today for ear  fullness. ? ?Diagnoses and all orders for this visit: ? ?Seasonal allergic rhinitis due to pollen- He does not tolerate antihistamines so will start a leukotriene inhibitor. ?-     montelukast (SINGULAIR) 10 MG tablet; Take 1 tablet (10 mg total) by mouth at bedtime. ?-     methylPREDNISolone (MEDROL DOSEPAK) 4 MG TBPK tablet; TAKE AS DIRECTED ? ?Eustachian tube disorder, bilateral- Will treat this with a 6-day course of methylprednisolone. ?-     methylPREDNISolone (MEDROL DOSEPAK) 4 MG TBPK tablet; TAKE AS DIRECTED ? ? ?I am having Zachary Burke start on montelukast and methylPREDNISolone. I am also having him maintain his psyllium, Cholecalciferol, tiZANidine, and Align. ? ?Meds ordered this encounter  ?Medications  ? montelukast (SINGULAIR) 10 MG tablet  ?  Sig: Take 1 tablet (10 mg total) by mouth at bedtime.  ?  Dispense:  90 tablet  ?  Refill:  1  ? methylPREDNISolone (MEDROL DOSEPAK) 4 MG TBPK tablet  ?  Sig: TAKE AS DIRECTED  ?  Dispense:  21 tablet  ?  Refill:  0  ? ? ? ?Follow-up: Return if symptoms worsen or fail to improve. ? ?Scarlette Calico, MD ?

## 2022-02-28 NOTE — Patient Instructions (Signed)
Eustachian Tube Dysfunction °Eustachian tube dysfunction refers to a condition in which a blockage develops in the narrow passage that connects the middle ear to the back of the nose (eustachian tube). The eustachian tube regulates air pressure in the middle ear by letting air move between the ear and nose. It also helps to drain fluid from the middle ear space. °Eustachian tube dysfunction can affect one or both ears. When the eustachian tube does not function properly, air pressure, fluid, or both can build up in the middle ear. °What are the causes? °This condition occurs when the eustachian tube becomes blocked or cannot open normally. Common causes of this condition include: °Ear infections. °Colds and other infections that affect the nose, mouth, and throat (upper respiratory tract). °Allergies. °Irritation from cigarette smoke. °Irritation from stomach acid coming up into the esophagus (gastroesophageal reflux). The esophagus is the part of the body that moves food from the mouth to the stomach. °Sudden changes in air pressure, such as from descending in an airplane or scuba diving. °Abnormal growths in the nose or throat, such as: °Growths that line the nose (nasal polyps). °Abnormal growth of cells (tumors). °Enlarged tissue at the back of the throat (adenoids). °What increases the risk? °You are more likely to develop this condition if: °You smoke. °You are overweight. °You are a child who has: °Certain birth defects of the mouth, such as cleft palate. °Large tonsils or adenoids. °What are the signs or symptoms? °Common symptoms of this condition include: °A feeling of fullness in the ear. °Ear pain. °Clicking or popping noises in the ear. °Ringing in the ear (tinnitus). °Hearing loss. °Loss of balance. °Dizziness. °Symptoms may get worse when the air pressure around you changes, such as when you travel to an area of high elevation, fly on an airplane, or go scuba diving. °How is this diagnosed? °This  condition may be diagnosed based on: °Your symptoms. °A physical exam of your ears, nose, and throat. °Tests, such as those that measure: °The movement of your eardrum. °Your hearing (audiometry). °How is this treated? °Treatment depends on the cause and severity of your condition. °In mild cases, you may relieve your symptoms by moving air into your ears. This is called "popping the ears." °In more severe cases, or if you have symptoms of fluid in your ears, treatment may include: °Medicines to relieve congestion (decongestants). °Medicines that treat allergies (antihistamines). °Nasal sprays or ear drops that contain medicines that reduce swelling (steroids). °A procedure to drain the fluid in your eardrum. In this procedure, a small tube may be placed in the eardrum to: °Drain the fluid. °Restore the air in the middle ear space. °A procedure to insert a balloon device through the nose to inflate the opening of the eustachian tube (balloon dilation). °Follow these instructions at home: °Lifestyle °Do not do any of the following until your health care provider approves: °Travel to high altitudes. °Fly in airplanes. °Work in a pressurized cabin or room. °Scuba dive. °Do not use any products that contain nicotine or tobacco. These products include cigarettes, chewing tobacco, and vaping devices, such as e-cigarettes. If you need help quitting, ask your health care provider. °Keep your ears dry. Wear fitted earplugs during showering and bathing. Dry your ears completely after. °General instructions °Take over-the-counter and prescription medicines only as told by your health care provider. °Use techniques to help pop your ears as recommended by your health care provider. These may include: °Chewing gum. °Yawning. °Frequent, forceful swallowing. °Closing   your mouth, holding your nose closed, and gently blowing as if you are trying to blow air out of your nose. °Keep all follow-up visits. This is important. °Contact a  health care provider if: °Your symptoms do not go away after treatment. °Your symptoms come back after treatment. °You are unable to pop your ears. °You have: °A fever. °Pain in your ear. °Pain in your head or neck. °Fluid draining from your ear. °Your hearing suddenly changes. °You become very dizzy. °You lose your balance. °Get help right away if: °You have a sudden, severe increase in any of your symptoms. °Summary °Eustachian tube dysfunction refers to a condition in which a blockage develops in the eustachian tube. °It can be caused by ear infections, allergies, inhaled irritants, or abnormal growths in the nose or throat. °Symptoms may include ear pain or fullness, hearing loss, or ringing in the ears. °Mild cases are treated with techniques to unblock the ears, such as yawning or chewing gum. °More severe cases are treated with medicines or procedures. °This information is not intended to replace advice given to you by your health care provider. Make sure you discuss any questions you have with your health care provider. °Document Revised: 01/16/2021 Document Reviewed: 01/16/2021 °Elsevier Patient Education © 2022 [employer] ° °

## 2022-03-13 DIAGNOSIS — H524 Presbyopia: Secondary | ICD-10-CM | POA: Diagnosis not present

## 2022-03-13 DIAGNOSIS — H2513 Age-related nuclear cataract, bilateral: Secondary | ICD-10-CM | POA: Diagnosis not present

## 2022-03-18 ENCOUNTER — Encounter: Payer: Self-pay | Admitting: Internal Medicine

## 2022-04-06 ENCOUNTER — Other Ambulatory Visit: Payer: Self-pay | Admitting: General Surgery

## 2022-04-06 DIAGNOSIS — R1031 Right lower quadrant pain: Secondary | ICD-10-CM | POA: Diagnosis not present

## 2022-04-09 ENCOUNTER — Ambulatory Visit
Admission: RE | Admit: 2022-04-09 | Discharge: 2022-04-09 | Disposition: A | Discharge status: Home / Self Care | Payer: Medicare HMO | Source: Ambulatory Visit | Attending: General Surgery | Admitting: General Surgery

## 2022-04-09 DIAGNOSIS — R1031 Right lower quadrant pain: Secondary | ICD-10-CM

## 2022-04-09 DIAGNOSIS — R10813 Right lower quadrant abdominal tenderness: Secondary | ICD-10-CM | POA: Diagnosis not present

## 2022-04-10 ENCOUNTER — Encounter: Payer: Self-pay | Admitting: Internal Medicine

## 2022-04-23 ENCOUNTER — Encounter: Payer: Self-pay | Admitting: Internal Medicine

## 2022-04-25 ENCOUNTER — Encounter: Payer: Self-pay | Admitting: Internal Medicine

## 2022-04-25 ENCOUNTER — Ambulatory Visit (INDEPENDENT_AMBULATORY_CARE_PROVIDER_SITE_OTHER): Payer: Medicare HMO | Admitting: Internal Medicine

## 2022-04-25 VITALS — BP 134/82 | HR 72 | Temp 97.8°F | Resp 16 | Ht 71.5 in | Wt 171.0 lb

## 2022-04-25 DIAGNOSIS — R0609 Other forms of dyspnea: Secondary | ICD-10-CM | POA: Diagnosis not present

## 2022-04-25 DIAGNOSIS — R002 Palpitations: Secondary | ICD-10-CM | POA: Diagnosis not present

## 2022-04-25 DIAGNOSIS — R001 Bradycardia, unspecified: Secondary | ICD-10-CM | POA: Diagnosis not present

## 2022-04-25 DIAGNOSIS — G608 Other hereditary and idiopathic neuropathies: Secondary | ICD-10-CM | POA: Insufficient documentation

## 2022-04-25 LAB — CBC WITH DIFFERENTIAL/PLATELET
Basophils Absolute: 0 10*3/uL (ref 0.0–0.1)
Basophils Relative: 0.9 % (ref 0.0–3.0)
Eosinophils Absolute: 0.2 10*3/uL (ref 0.0–0.7)
Eosinophils Relative: 3.5 % (ref 0.0–5.0)
HCT: 47.4 % (ref 39.0–52.0)
Hemoglobin: 15.5 g/dL (ref 13.0–17.0)
Lymphocytes Relative: 26.4 % (ref 12.0–46.0)
Lymphs Abs: 1.4 10*3/uL (ref 0.7–4.0)
MCHC: 32.6 g/dL (ref 30.0–36.0)
MCV: 89.1 fl (ref 78.0–100.0)
Monocytes Absolute: 0.6 10*3/uL (ref 0.1–1.0)
Monocytes Relative: 12.2 % — ABNORMAL HIGH (ref 3.0–12.0)
Neutro Abs: 3 10*3/uL (ref 1.4–7.7)
Neutrophils Relative %: 57 % (ref 43.0–77.0)
Platelets: 210 10*3/uL (ref 150.0–400.0)
RBC: 5.32 Mil/uL (ref 4.22–5.81)
RDW: 14.6 % (ref 11.5–15.5)
WBC: 5.2 10*3/uL (ref 4.0–10.5)

## 2022-04-25 LAB — BASIC METABOLIC PANEL
BUN: 19 mg/dL (ref 6–23)
CO2: 29 mEq/L (ref 19–32)
Calcium: 9.8 mg/dL (ref 8.4–10.5)
Chloride: 102 mEq/L (ref 96–112)
Creatinine, Ser: 0.93 mg/dL (ref 0.40–1.50)
GFR: 83.28 mL/min (ref >60.00)
Glucose, Bld: 73 mg/dL (ref 70–99)
Potassium: 4.5 mEq/L (ref 3.5–5.1)
Sodium: 138 mEq/L (ref 135–145)

## 2022-04-25 LAB — BRAIN NATRIURETIC PEPTIDE: Pro B Natriuretic peptide (BNP): 22 pg/mL (ref 0.0–100.0)

## 2022-04-25 LAB — TROPONIN I (HIGH SENSITIVITY): High Sens Troponin I: 3 ng/L (ref 2–17)

## 2022-04-25 LAB — TSH: TSH: 2.04 u[IU]/mL (ref 0.35–5.50)

## 2022-04-25 NOTE — Patient Instructions (Signed)
Bradycardia, Adult Bradycardia is a slower-than-normal heartbeat. A normal resting heart rate for an adult ranges from 60 to 100 beats per minute. With bradycardia, the resting heart rate is less than 60 beats per minute. Bradycardia can prevent enough oxygen from reaching certain areas of your body when you are active. It can be serious if it keeps enough oxygen from reaching your brain and other parts of your body. Bradycardia is not a problem for everyone. For some healthy adults, a slow resting heart rate is normal. What are the causes? This condition may be caused by: A problem with the heart, including: A problem with the heart's electrical system, such as a heart block. With a heart block, electrical signals between the chambers of the heart are partially or completely blocked, so they are not able to work as they should. A problem with the heart's natural pacemaker (sinus node). Heart disease. A heart attack. Heart damage. Lyme disease. A heart infection. A heart condition that is present at birth (congenital heart defect). Certain medicines that treat heart conditions. Certain conditions, such as hypothyroidism and obstructive sleep apnea. Problems with the balance of chemicals and other substances, like potassium, in the blood. Trauma. Radiation therapy. What increases the risk? You are more likely to develop this condition if you: Are age 65 or older. Have high blood pressure (hypertension), high cholesterol (hyperlipidemia), or diabetes. Drink heavily, use tobacco or nicotine products, or use drugs. What are the signs or symptoms? Symptoms of this condition include: Light-headedness. Feeling faint or fainting. Fatigue and weakness. Trouble with activity or exercise. Shortness of breath. Chest pain (angina). Drowsiness. Confusion. Dizziness. How is this diagnosed? This condition may be diagnosed based on: Your symptoms. Your medical history. A physical exam. During  the exam, your health care provider will listen to your heartbeat and check your pulse. To confirm the diagnosis, your health care provider may order tests, such as: Blood tests. An electrocardiogram (ECG). This test records the heart's electrical activity. The test can show how fast your heart is beating and whether the heartbeat is steady. A test in which you wear a portable device (event recorder or Holter monitor) to record your heart's electrical activity while you go about your day. An exercise test. How is this treated? Treatment for this condition depends on the cause of the condition and how severe your symptoms are. Treatment may involve: Treatment of the underlying condition. Changing your medicines or how much medicine you take. Having a small, battery-operated device called a pacemaker implanted under the skin. When bradycardia occurs, this device can be used to increase your heart rate and help your heart beat in a regular rhythm. Follow these instructions at home: Lifestyle Manage any health conditions that contribute to bradycardia as told by your health care provider. Follow a heart-healthy diet. A nutrition specialist (dietitian) can help educate you about healthy food options and changes. Follow an exercise program that is approved by your health care provider. Maintain a healthy weight. Try to reduce or manage your stress, such as with yoga or meditation. If you need help reducing stress, ask your health care provider. Do not use any products that contain nicotine or tobacco. These products include cigarettes, chewing tobacco, and vaping devices, such as e-cigarettes. If you need help quitting, ask your health care provider. Do not use illegal drugs. Alcohol use If you drink alcohol: Limit how much you have to: 0-1 drink a day for women who are not pregnant. 0-2 drinks a day   for men. Know how much alcohol is in a drink. In the [place] one drink equals one 12 oz bottle of  beer (355 mL), one 5 oz glass of wine (148 mL), or one 1 oz glass of hard liquor (44 mL). General instructions Take over-the-counter and prescription medicines only as told by your health care provider. Keep all follow-up visits. This is important. How is this prevented? In some cases, bradycardia may be prevented by: Treating underlying medical problems. Stopping behaviors or medicines that can trigger the condition. Contact a health care provider if: You feel light-headed or dizzy. You almost faint. You feel weak or are easily fatigued during physical activity. You experience confusion or have memory problems. Get help right away if: You faint. You have chest pains or an irregular heartbeat (palpitations). You have trouble breathing. These symptoms may represent a serious problem that is an emergency. Do not wait to see if the symptoms will go away. Get medical help right away. Call your local emergency services (911 in the [place] Do not drive yourself to the hospital. Summary Bradycardia is a slower-than-normal heartbeat. With bradycardia, the resting heart rate is less than 60 beats per minute. Treatment for this condition depends on the cause. Manage any health conditions that contribute to bradycardia as told by your health care provider. Do not use any products that contain nicotine or tobacco. These products include cigarettes, chewing tobacco, and vaping devices, such as e-cigarettes. Keep all follow-up visits. This is important. This information is not intended to replace advice given to you by your health care provider. Make sure you discuss any questions you have with your health care provider. Document Revised: 02/26/2021 Document Reviewed: 02/26/2021 Elsevier Patient Education  2023 Elsevier Inc.  

## 2022-04-25 NOTE — Progress Notes (Signed)
Subjective:  Patient ID: Zachary Burke, male    DOB: 05/24/1951  Age: 71 y.o. MRN: [id number]  CC: No chief complaint on file.   HPI [name] presents for f/up-  He complains of recurrent episode of near-syncope caused by emotional and physical stress. He also occasionally had twinges of pain in his chest and DOE.  Outpatient Medications Prior to Visit  Medication Sig Dispense Refill   Cholecalciferol 50 MCG (2000 UT) TABS Take 1 tablet (2,000 Units total) by mouth daily. 90 tablet 1   montelukast (SINGULAIR) 10 MG tablet Take 1 tablet (10 mg total) by mouth at bedtime. 90 tablet 1   Probiotic Product (ALIGN) 4 MG CAPS Take 1 capsule (4 mg total) by mouth daily. 30 capsule 1   psyllium (METAMUCIL) 58.6 % packet Take 1 packet by mouth daily.     tiZANidine (ZANAFLEX) 2 MG tablet Take 1 tablet (2 mg total) by mouth every 8 (eight) hours as needed for muscle spasms. 90 tablet 0   No facility-administered medications prior to visit.    ROS Review of Systems  Constitutional:  Positive for fatigue. Negative for chills, diaphoresis and unexpected weight change.  HENT: Negative.    Respiratory:  Positive for shortness of breath. Negative for cough, chest tightness and wheezing.   Cardiovascular:  Positive for chest pain and palpitations.  Gastrointestinal:  Negative for abdominal pain, constipation, diarrhea and nausea.  Musculoskeletal: Negative.  Negative for arthralgias, joint swelling and myalgias.  Skin: Negative.   Neurological:  Positive for light-headedness. Negative for dizziness, syncope and weakness.  Hematological:  Negative for adenopathy. Does not bruise/bleed easily.  Psychiatric/Behavioral: Negative.      Objective:  BP 134/82 (BP Location: Left Arm, Patient Position: Sitting, Cuff Size: Large)   Pulse 72   Temp 97.8 F (36.6 C) (Oral)   Resp 16   Ht 5' 11.5" (1.816 m)   Wt 171 lb (77.6 kg)   SpO2 98%   BMI 23.52 kg/m   BP Readings from Last 3  Encounters:  04/25/22 134/82  02/28/22 122/74  11/28/21 120/68    Wt Readings from Last 3 Encounters:  04/25/22 171 lb (77.6 kg)  02/28/22 171 lb (77.6 kg)  11/28/21 171 lb 9.6 oz (77.8 kg)    Physical Exam Vitals reviewed.  HENT:     Nose: Nose normal.     Mouth/Throat:     Mouth: Mucous membranes are moist.  Eyes:     General: No scleral icterus.    Conjunctiva/sclera: Conjunctivae normal.  Cardiovascular:     Rate and Rhythm: Regular rhythm. Bradycardia present.     Heart sounds: Normal heart sounds, S1 normal and S2 normal. No murmur heard.    Comments: EKG- SB, 55 bpm No LVH or Q waves Pulmonary:     Effort: Pulmonary effort is normal.     Breath sounds: No stridor. No wheezing, rhonchi or rales.  Abdominal:     General: Abdomen is flat.     Palpations: There is no mass.     Tenderness: There is no abdominal tenderness. There is no guarding or rebound.     Hernia: No hernia is present.  Musculoskeletal:        General: Normal range of motion.     Right lower leg: No edema.     Left lower leg: No edema.  Skin:    General: Skin is warm and dry.  Neurological:     General: No focal deficit present.  Mental Status: He is alert. Mental status is at baseline.  Psychiatric:        Mood and Affect: Mood normal.        Behavior: Behavior normal.     Lab Results  Component Value Date   WBC 5.2 04/25/2022   HGB 15.5 04/25/2022   HCT 47.4 04/25/2022   PLT 210.0 04/25/2022   GLUCOSE 73 04/25/2022   CHOL 134 10/11/2021   TRIG 83.0 10/11/2021   HDL 40.10 10/11/2021   LDLCALC 77 10/11/2021   ALT 15 10/11/2021   AST 23 10/11/2021   NA 138 04/25/2022   K 4.5 04/25/2022   CL 102 04/25/2022   CREATININE 0.93 04/25/2022   BUN 19 04/25/2022   CO2 29 04/25/2022   TSH 2.04 04/25/2022   PSA 1.49 10/11/2021   HGBA1C 5.5 07/12/2020    US PELVIS LIMITED (TRANSABDOMINAL ONLY)  Result Date: 04/09/2022 CLINICAL DATA:  Right groin tenderness history of inguinal  hernia repair EXAM: LIMITED ULTRASOUND OF PELVIS TECHNIQUE: Limited transabdominal ultrasound examination of the pelvis was performed. COMPARISON:  None Available. FINDINGS: Targeted ultrasound of the right groin performed with and without Valsalva. Additional sonography of right umbilical region performed in the region of concern. Hypoechoic area over the right groin, question mesh material related to prior hernia repair. No definite bowel containing hernia seen in the right groin. No definite sonographic correlate for right periumbilical discomfort. IMPRESSION: 1. Hypoechoic area with shadowing in the right groin in the region of concern, question mesh material related to prior hernia repair. No definite bowel containing right groin hernia, CT could be considered for further evaluation as indicated 2. No definite sonographic correlate for right periumbilical tenderness, recommend clinical management. Electronically Signed   By: Donavan Foil M.D.   On: 04/09/2022 23:34    Assessment & Plan:   Diagnoses and all orders for this visit:  Palpitations- I recommended an ETT and event monitor to see if the bradycardia is contributing to his symptoms. -     EKG 12-Lead -     CARDIAC EVENT MONITOR; Future -     EXERCISE TOLERANCE TEST (ETT); Future -     Basic metabolic panel; Future -     TSH; Future -     CBC with Differential/Platelet; Future -     CBC with Differential/Platelet -     TSH -     Basic metabolic panel  DOE (dyspnea on exertion)- His labs are reassuring. -     EKG 12-Lead -     EXERCISE TOLERANCE TEST (ETT); Future -     Basic metabolic panel; Future -     TSH; Future -     CBC with Differential/Platelet; Future -     Troponin I (High Sensitivity); Future -     Brain natriuretic peptide; Future -     Brain natriuretic peptide -     Troponin I (High Sensitivity) -     CBC with Differential/Platelet -     TSH -     Basic metabolic panel  Bradycardia -     CARDIAC EVENT  MONITOR; Future -     EXERCISE TOLERANCE TEST (ETT); Future -     Basic metabolic panel; Future -     TSH; Future -     CBC with Differential/Platelet; Future -     CBC with Differential/Platelet -     TSH -     Basic metabolic panel   I am having Ruben Gottron  maintain his psyllium, Cholecalciferol, tiZANidine, Align, and montelukast.  No orders of the defined types were placed in this encounter.    Follow-up: Return in about 3 months (around 07/26/2022).  Scarlette Calico, MD

## 2022-05-06 ENCOUNTER — Encounter: Payer: Self-pay | Admitting: Internal Medicine

## 2022-05-08 ENCOUNTER — Other Ambulatory Visit: Payer: Self-pay | Admitting: Internal Medicine

## 2022-05-08 ENCOUNTER — Encounter: Payer: Self-pay | Admitting: Internal Medicine

## 2022-05-08 ENCOUNTER — Ambulatory Visit (INDEPENDENT_AMBULATORY_CARE_PROVIDER_SITE_OTHER): Payer: Medicare HMO | Admitting: Internal Medicine

## 2022-05-08 VITALS — BP 132/78 | HR 67 | Temp 98.1°F | Resp 16 | Ht 71.5 in | Wt 172.0 lb

## 2022-05-08 DIAGNOSIS — R001 Bradycardia, unspecified: Secondary | ICD-10-CM

## 2022-05-08 DIAGNOSIS — J0100 Acute maxillary sinusitis, unspecified: Secondary | ICD-10-CM

## 2022-05-08 DIAGNOSIS — R0609 Other forms of dyspnea: Secondary | ICD-10-CM

## 2022-05-08 MED ORDER — HYDROCODONE BIT-HOMATROP MBR 5-1.5 MG/5ML PO SOLN: 5.0000 mL | Freq: Three times a day (TID) | ORAL | 0 refills | Status: DC | PRN

## 2022-05-08 MED ORDER — AMOXICILLIN-POT CLAVULANATE 600-42.9 MG/5ML PO SUSR: 600.0000 mg | Freq: Three times a day (TID) | ORAL | 0 refills | Status: AC

## 2022-05-08 NOTE — Patient Instructions (Signed)

## 2022-05-08 NOTE — Progress Notes (Signed)
Subjective:  Patient ID: Nishanth Mccaughan, male    DOB: Apr 07, 1951  Age: 71 y.o. MRN: [id number]  CC: Sinusitis   HPI Draiden Mirsky presents for f/up -   His symptoms started 2 weeks ago with sore throat.  The sore throat has resolved but he has persistent thick yellow/green nasal phlegm that is occasionally bloody.  He has tested negative for COVID.  He has facial pain and nonproductive cough.  He denies fever, chills, night sweats, chest pain, shortness of breath, or hemoptysis.  Outpatient Medications Prior to Visit  Medication Sig Dispense Refill   Cholecalciferol 50 MCG (2000 UT) TABS Take 1 tablet (2,000 Units total) by mouth daily. 90 tablet 1   Probiotic Product (ALIGN) 4 MG CAPS Take 1 capsule (4 mg total) by mouth daily. 30 capsule 1   psyllium (METAMUCIL) 58.6 % packet Take 1 packet by mouth daily.     tiZANidine (ZANAFLEX) 2 MG tablet Take 1 tablet (2 mg total) by mouth every 8 (eight) hours as needed for muscle spasms. 90 tablet 0   montelukast (SINGULAIR) 10 MG tablet Take 1 tablet (10 mg total) by mouth at bedtime. 90 tablet 1   No facility-administered medications prior to visit.    ROS Review of Systems  Constitutional: Negative.  Negative for chills, fatigue and fever.  HENT:  Positive for nosebleeds, rhinorrhea, sinus pressure, sinus pain and sore throat. Negative for postnasal drip, trouble swallowing and voice change.   Respiratory:  Positive for cough. Negative for chest tightness, shortness of breath and wheezing.   Cardiovascular:  Negative for chest pain, palpitations and leg swelling.  Gastrointestinal:  Negative for abdominal pain, constipation, diarrhea, nausea and vomiting.  Genitourinary: Negative.   Musculoskeletal: Negative.   Skin: Negative.  Negative for rash.  Neurological:  Negative for dizziness and light-headedness.  Hematological:  Negative for adenopathy. Does not bruise/bleed easily.  Psychiatric/Behavioral: Negative.      Objective:   BP 132/78 (BP Location: Right Arm, Patient Position: Sitting, Cuff Size: Large)   Pulse 67   Temp 98.1 F (36.7 C) (Oral)   Resp 16   Ht 5' 11.5" (1.816 m)   Wt 172 lb (78 kg)   SpO2 97%   BMI 23.65 kg/m   BP Readings from Last 3 Encounters:  05/08/22 132/78  04/25/22 134/82  02/28/22 122/74    Wt Readings from Last 3 Encounters:  05/08/22 172 lb (78 kg)  04/25/22 171 lb (77.6 kg)  02/28/22 171 lb (77.6 kg)    Physical Exam Vitals reviewed.  Constitutional:      General: He is not in acute distress.    Appearance: He is not ill-appearing, toxic-appearing or diaphoretic.  HENT:     Nose: Rhinorrhea present. No congestion. Rhinorrhea is purulent.     Right Nostril: No epistaxis.     Left Nostril: No epistaxis.     Right Sinus: Maxillary sinus tenderness present. No frontal sinus tenderness.     Left Sinus: Maxillary sinus tenderness present. No frontal sinus tenderness.  Eyes:     General: No scleral icterus.    Conjunctiva/sclera: Conjunctivae normal.  Cardiovascular:     Rate and Rhythm: Normal rate and regular rhythm.     Heart sounds: No murmur heard. Pulmonary:     Effort: Pulmonary effort is normal.     Breath sounds: No stridor. No wheezing, rhonchi or rales.  Abdominal:     General: Abdomen is flat.     Tenderness: There is no abdominal  tenderness. There is no guarding.     Hernia: No hernia is present.  Musculoskeletal:        General: Normal range of motion.     Cervical back: Neck supple.     Right lower leg: No edema.     Left lower leg: No edema.  Lymphadenopathy:     Cervical: No cervical adenopathy.  Skin:    General: Skin is warm and dry.  Neurological:     General: No focal deficit present.     Mental Status: He is alert.  Psychiatric:        Mood and Affect: Mood normal.        Behavior: Behavior normal.     Lab Results  Component Value Date   WBC 5.2 04/25/2022   HGB 15.5 04/25/2022   HCT 47.4 04/25/2022   PLT 210.0 04/25/2022    GLUCOSE 73 04/25/2022   CHOL 134 10/11/2021   TRIG 83.0 10/11/2021   HDL 40.10 10/11/2021   LDLCALC 77 10/11/2021   ALT 15 10/11/2021   AST 23 10/11/2021   NA 138 04/25/2022   K 4.5 04/25/2022   CL 102 04/25/2022   CREATININE 0.93 04/25/2022   BUN 19 04/25/2022   CO2 29 04/25/2022   TSH 2.04 04/25/2022   PSA 1.49 10/11/2021   HGBA1C 5.5 07/12/2020    US PELVIS LIMITED (TRANSABDOMINAL ONLY)  Result Date: 04/09/2022 CLINICAL DATA:  Right groin tenderness history of inguinal hernia repair EXAM: LIMITED ULTRASOUND OF PELVIS TECHNIQUE: Limited transabdominal ultrasound examination of the pelvis was performed. COMPARISON:  None Available. FINDINGS: Targeted ultrasound of the right groin performed with and without Valsalva. Additional sonography of right umbilical region performed in the region of concern. Hypoechoic area over the right groin, question mesh material related to prior hernia repair. No definite bowel containing hernia seen in the right groin. No definite sonographic correlate for right periumbilical discomfort. IMPRESSION: 1. Hypoechoic area with shadowing in the right groin in the region of concern, question mesh material related to prior hernia repair. No definite bowel containing right groin hernia, CT could be considered for further evaluation as indicated 2. No definite sonographic correlate for right periumbilical tenderness, recommend clinical management. Electronically Signed   By: Donavan Foil M.D.   On: 04/09/2022 23:34    Assessment & Plan:   Leman was seen today for sinusitis.  Diagnoses and all orders for this visit:  Acute non-recurrent maxillary sinusitis- I will treat the infection with Augmentin and will offer symptom relief. -     amoxicillin-clavulanate (AUGMENTIN) 600-42.9 MG/5ML suspension; Take 5 mLs (600 mg total) by mouth with breakfast, with lunch, and with evening meal for 10 days. -     HYDROcodone bit-homatropine (HYCODAN) 5-1.5 MG/5ML syrup;  Take 5 mLs by mouth every 8 (eight) hours as needed for cough.   I have discontinued Broadus John Curet's montelukast. I am also having him maintain his psyllium, Cholecalciferol, tiZANidine, Align, amoxicillin-clavulanate, and HYDROcodone bit-homatropine.  Meds ordered this encounter  Medications   amoxicillin-clavulanate (AUGMENTIN) 600-42.9 MG/5ML suspension    Sig: Take 5 mLs (600 mg total) by mouth with breakfast, with lunch, and with evening meal for 10 days.    Dispense:  200 mL    Refill:  0   HYDROcodone bit-homatropine (HYCODAN) 5-1.5 MG/5ML syrup    Sig: Take 5 mLs by mouth every 8 (eight) hours as needed for cough.    Dispense:  120 mL    Refill:  0  Follow-up: No follow-ups on file.  Scarlette Calico, MD

## 2022-05-14 ENCOUNTER — Ambulatory Visit (INDEPENDENT_AMBULATORY_CARE_PROVIDER_SITE_OTHER): Payer: Medicare HMO

## 2022-05-14 DIAGNOSIS — R0609 Other forms of dyspnea: Secondary | ICD-10-CM

## 2022-05-14 DIAGNOSIS — R001 Bradycardia, unspecified: Secondary | ICD-10-CM | POA: Diagnosis not present

## 2022-05-23 ENCOUNTER — Other Ambulatory Visit: Payer: Self-pay | Admitting: Internal Medicine

## 2022-05-23 DIAGNOSIS — R0609 Other forms of dyspnea: Secondary | ICD-10-CM

## 2022-05-23 DIAGNOSIS — R001 Bradycardia, unspecified: Secondary | ICD-10-CM

## 2022-05-24 ENCOUNTER — Ambulatory Visit: Payer: Medicare HMO

## 2022-05-24 DIAGNOSIS — R001 Bradycardia, unspecified: Secondary | ICD-10-CM | POA: Diagnosis not present

## 2022-05-24 DIAGNOSIS — R0609 Other forms of dyspnea: Secondary | ICD-10-CM

## 2022-05-24 LAB — EXERCISE TOLERANCE TEST
Angina Index: 0
Duke Treadmill Score: 9
Estimated workload: 10.1
Exercise duration (min): 9 min
Exercise duration (sec): 0 s
MPHR: 150 {beats}/min
Peak HR: 134 {beats}/min
Percent HR: 89 %
RPE: 15
Rest HR: 57 {beats}/min
ST Depression (mm): 0 mm

## 2022-06-02 ENCOUNTER — Encounter: Payer: Self-pay | Admitting: Internal Medicine

## 2022-06-03 ENCOUNTER — Telehealth: Payer: Medicare HMO | Admitting: Family

## 2022-06-03 DIAGNOSIS — K279 Peptic ulcer, site unspecified, unspecified as acute or chronic, without hemorrhage or perforation: Secondary | ICD-10-CM | POA: Diagnosis not present

## 2022-06-03 MED ORDER — OMEPRAZOLE 40 MG PO CPDR: 40.0000 mg | DELAYED_RELEASE_CAPSULE | Freq: Every day | ORAL | 0 refills | Status: DC

## 2022-06-03 MED ORDER — SUCRALFATE 1 GM/10ML PO SUSP: 1.0000 g | Freq: Three times a day (TID) | ORAL | 0 refills | Status: DC

## 2022-06-03 NOTE — Progress Notes (Signed)
Virtual Visit Consent   [name], you are scheduled for a virtual visit with a [facility] provider today. Just as with appointments in the office, your consent must be obtained to participate. Your consent will be active for this visit and any virtual visit you may have with one of our providers in the next 365 days. If you have a MyChart account, a copy of this consent can be sent to you electronically.  As this is a virtual visit, video technology does not allow for your provider to perform a traditional examination. This may limit your provider's ability to fully assess your condition. If your provider identifies any concerns that need to be evaluated in person or the need to arrange testing (such as labs, EKG, etc.), we will make arrangements to do so. Although advances in technology are sophisticated, we cannot ensure that it will always work on either your end or our end. If the connection with a video visit is poor, the visit may have to be switched to a telephone visit. With either a video or telephone visit, we are not always able to ensure that we have a secure connection.  By engaging in this virtual visit, you consent to the provision of healthcare and authorize for your insurance to be billed (if applicable) for the services provided during this visit. Depending on your insurance coverage, you may receive a charge related to this service.  I need to obtain your verbal consent now. Are you willing to proceed with your visit today? Jacarri Gesner has provided verbal consent on 06/03/2022 for a virtual visit (video or telephone). Evelina Dun, FNP  Date: 06/03/2022 8:07 AM  Virtual Visit via Video Note   I, Evelina Dun, connected with  Zachary Burke  ([id number], 04/03/1951) on 06/03/22 at  8:00 AM EDT by a video-enabled telemedicine application and verified that I am speaking with the correct person using two identifiers.  Location: Patient: Virtual Visit Location Patient:  Home Provider: Virtual Visit Location Provider: Home Office   I discussed the limitations of evaluation and management by telemedicine and the availability of in person appointments. The patient expressed understanding and agreed to proceed.    History of Present Illness: Zachary Burke is a 71 y.o. who identifies as a male who was assigned male at birth, and is being seen today for hx of stomach ulcer and took carafate with moderate relief.  HPI: Abdominal Pain This is a recurrent problem. The current episode started in the past 7 days (two days). The onset quality is sudden. The problem occurs intermittently. The problem has been waxing and waning. The pain is located in the epigastric region. The pain is at a severity of 8/10. The pain is mild. The quality of the pain is sharp. The abdominal pain radiates to the epigastric region. Associated symptoms include belching and flatus. Pertinent negatives include no constipation, diarrhea, fever, frequency, headaches, hematochezia, hematuria, nausea or vomiting. The pain is aggravated by eating. He has tried proton pump inhibitors for the symptoms. The treatment provided mild relief.    Problems:  Patient Active Problem List   Diagnosis Date Noted   Acute non-recurrent maxillary sinusitis 05/08/2022   Peripheral sensory neuropathy 04/25/2022   Bradycardia 04/25/2022   DOE (dyspnea on exertion) 04/25/2022   Palpitations 04/25/2022   Seasonal allergic rhinitis due to pollen 02/28/2022   Eustachian tube disorder, bilateral 02/28/2022   Nocturia more than twice per night 11/30/2021   Snorings 11/30/2021   Mild obstructive sleep apnea-hypopnea  syndrome 11/30/2021   Chronic sinusitis 09/07/2021   Chronic bilateral low back pain without sciatica 01/31/2021   Vitamin D deficiency disease 07/13/2020   Erectile dysfunction due to arterial insufficiency 07/12/2020   Routine general medical examination at a health care facility 07/12/2020   Benign  prostatic hyperplasia without lower urinary tract symptoms 07/12/2020   Hyperlipidemia LDL goal <130 07/12/2020   PUD (peptic ulcer disease) 07/12/2020   Elevated systolic blood pressure reading without diagnosis of hypertension 07/12/2020   Esophageal reflux 08/21/2010   Personal history of colonic polyps 08/21/2010    Allergies: No Known Allergies Medications:  Current Outpatient Medications:    omeprazole (PRILOSEC) 40 MG capsule, Take 1 capsule (40 mg total) by mouth daily., Disp: 30 capsule, Rfl: 0   sucralfate (CARAFATE) 1 GM/10ML suspension, Take 10 mLs (1 g total) by mouth 4 (four) times daily -  with meals and at bedtime., Disp: 420 mL, Rfl: 0   Cholecalciferol 50 MCG (2000 UT) TABS, Take 1 tablet (2,000 Units total) by mouth daily., Disp: 90 tablet, Rfl: 1   HYDROcodone bit-homatropine (HYCODAN) 5-1.5 MG/5ML syrup, Take 5 mLs by mouth every 8 (eight) hours as needed for cough., Disp: 120 mL, Rfl: 0   Probiotic Product (ALIGN) 4 MG CAPS, Take 1 capsule (4 mg total) by mouth daily., Disp: 30 capsule, Rfl: 1   psyllium (METAMUCIL) 58.6 % packet, Take 1 packet by mouth daily., Disp: , Rfl:    tiZANidine (ZANAFLEX) 2 MG tablet, Take 1 tablet (2 mg total) by mouth every 8 (eight) hours as needed for muscle spasms., Disp: 90 tablet, Rfl: 0  Observations/Objective: Patient is well-developed, well-nourished in no acute distress.  Resting comfortably  at home.  Head is normocephalic, atraumatic.  No labored breathing.  Speech is clear and coherent with logical content.  Patient is alert and oriented at baseline.  No acute pain at this time  Assessment and Plan: 1. Peptic ulcer disease - sucralfate (CARAFATE) 1 GM/10ML suspension; Take 10 mLs (1 g total) by mouth 4 (four) times daily -  with meals and at bedtime.  Dispense: 420 mL; Refill: 0 - omeprazole (PRILOSEC) 40 MG capsule; Take 1 capsule (40 mg total) by mouth daily.  Dispense: 30 capsule; Refill: 0  Will give carafate  rx. Start omeprazole 40 mg daily Follow up with PCP this week Avoid NSAID's  Follow Up Instructions: I discussed the assessment and treatment plan with the patient. The patient was provided an opportunity to ask questions and all were answered. The patient agreed with the plan and demonstrated an understanding of the instructions.  A copy of instructions were sent to the patient via MyChart unless otherwise noted below.    The patient was advised to call back or seek an in-person evaluation if the symptoms worsen or if the condition fails to improve as anticipated.  Time:  I spent 11 minutes with the patient via telehealth technology discussing the above problems/concerns.    Evelina Dun, FNP

## 2022-06-04 ENCOUNTER — Other Ambulatory Visit: Payer: Self-pay | Admitting: Internal Medicine

## 2022-06-04 DIAGNOSIS — Z86010 Personal history of colonic polyps: Secondary | ICD-10-CM

## 2022-06-04 DIAGNOSIS — K279 Peptic ulcer, site unspecified, unspecified as acute or chronic, without hemorrhage or perforation: Secondary | ICD-10-CM

## 2022-06-04 DIAGNOSIS — K219 Gastro-esophageal reflux disease without esophagitis: Secondary | ICD-10-CM

## 2022-06-14 ENCOUNTER — Encounter: Payer: Self-pay | Admitting: Internal Medicine

## 2022-06-27 ENCOUNTER — Other Ambulatory Visit: Payer: Self-pay | Admitting: Internal Medicine

## 2022-06-27 DIAGNOSIS — R0683 Snoring: Secondary | ICD-10-CM

## 2022-07-03 ENCOUNTER — Encounter: Payer: Self-pay | Admitting: Internal Medicine

## 2022-07-04 ENCOUNTER — Other Ambulatory Visit: Payer: Self-pay | Admitting: Internal Medicine

## 2022-07-04 DIAGNOSIS — R053 Chronic cough: Secondary | ICD-10-CM | POA: Insufficient documentation

## 2022-07-10 ENCOUNTER — Encounter: Payer: Self-pay | Admitting: Internal Medicine

## 2022-07-10 NOTE — Telephone Encounter (Signed)
Pt called and advised he is confused on the mychart message response. Pt said he is unsure if Dr. Ronnald Ramp means for him to just come up to the office today so that Dr. Ronnald Ramp can take a look, or does Dr. Ronnald Ramp want him to schedule an appointment to be seen?  Please advise

## 2022-07-11 ENCOUNTER — Encounter: Payer: Self-pay | Admitting: Family Medicine

## 2022-07-11 ENCOUNTER — Ambulatory Visit (INDEPENDENT_AMBULATORY_CARE_PROVIDER_SITE_OTHER): Payer: Medicare HMO | Admitting: Family Medicine

## 2022-07-11 VITALS — BP 122/78 | HR 60 | Temp 97.2°F | Ht 71.5 in | Wt 171.0 lb

## 2022-07-11 DIAGNOSIS — H60333 Swimmer's ear, bilateral: Secondary | ICD-10-CM | POA: Diagnosis not present

## 2022-07-11 DIAGNOSIS — Z8619 Personal history of other infectious and parasitic diseases: Secondary | ICD-10-CM

## 2022-07-11 MED ORDER — FLUCONAZOLE 150 MG PO TABS: 150.0000 mg | ORAL_TABLET | Freq: Every day | ORAL | 0 refills | Status: DC

## 2022-07-11 MED ORDER — NEOMYCIN-POLYMYXIN-HC 3.5-10000-1 OT SOLN: 4.0000 [drp] | Freq: Four times a day (QID) | OTIC | 0 refills | Status: DC

## 2022-07-11 NOTE — Progress Notes (Signed)
Subjective:  Zachary Burke is a 71 y.o. male who presents for evaluation of bilateral ear pain. Symptoms have been present for 10 days. He also notes  bilateral ear tenderness with L>R . He does have a history of ear infections. He does have a history of recent swimming.  No other aggravating or relieving factors.  ROS as in subjective   Objective: Vitals:   07/11/22 1442  BP: 122/78  Pulse: 60  Temp: (!) 97.2 F (36.2 C)  SpO2: 98%    General appearance: alert, no distress, WD/WN Ears: right external ear normal, left external ear tender, canal with erythema, edema and tenderness, right TM normal, left TM dull HEENT: normocephalic, sclerae anicteric, nares patent, no discharge or erythema pharynx normal Oral cavity: MMM, no lesions Neck: supple, no lymphadenopathy, no thyromegaly, no masses    Assessment: Acute swimmer's ear of both sides - Plan: fluconazole (DIFLUCAN) 150 MG tablet, neomycin-polymyxin-hydrocortisone (CORTISPORIN) OTIC solution, DISCONTINUED: neomycin-polymyxin-hydrocortisone (CORTISPORIN) OTIC solution  History of fungal infection    Plan: Treatment: neomycin/polymyxin B/hydrocortisone otic and oral diflucan prescribed.  OTC analgesia as needed. Water exclusion from affected ear until symptoms resolve. Follow up if not resolved on medications.

## 2022-07-11 NOTE — Patient Instructions (Signed)
Use the Cortisporin drops as prescribed in both ears x 7-10 days.   Take the oral diflucan once daily as prescribed.   Let us know if you are having any new or worsening symptoms or if you are not back to baseline after completing the treatment.

## 2022-07-17 ENCOUNTER — Other Ambulatory Visit: Payer: Self-pay | Admitting: Family Medicine

## 2022-07-18 ENCOUNTER — Other Ambulatory Visit: Payer: Self-pay | Admitting: Family Medicine

## 2022-07-18 MED ORDER — ACETIC ACID 2 % OT SOLN: 4.0000 [drp] | Freq: Four times a day (QID) | OTIC | 0 refills | Status: DC

## 2022-07-21 ENCOUNTER — Telehealth: Payer: Medicare HMO | Admitting: Physician Assistant

## 2022-07-21 DIAGNOSIS — H9203 Otalgia, bilateral: Secondary | ICD-10-CM

## 2022-07-21 MED ORDER — AMOXICILLIN-POT CLAVULANATE 400-57 MG/5ML PO SUSR: 800.0000 mg | Freq: Two times a day (BID) | ORAL | 0 refills | Status: AC

## 2022-07-21 NOTE — Progress Notes (Signed)
Virtual Visit Consent   Zachary Burke, you are scheduled for a virtual visit with a [facility] provider today. Just as with appointments in the office, your consent must be obtained to participate. Your consent will be active for this visit and any virtual visit you may have with one of our providers in the next 365 days. If you have a MyChart account, a copy of this consent can be sent to you electronically.  As this is a virtual visit, video technology does not allow for your provider to perform a traditional examination. This may limit your provider's ability to fully assess your condition. If your provider identifies any concerns that need to be evaluated in person or the need to arrange testing (such as labs, EKG, etc.), we will make arrangements to do so. Although advances in technology are sophisticated, we cannot ensure that it will always work on either your end or our end. If the connection with a video visit is poor, the visit may have to be switched to a telephone visit. With either a video or telephone visit, we are not always able to ensure that we have a secure connection.  By engaging in this virtual visit, you consent to the provision of healthcare and authorize for your insurance to be billed (if applicable) for the services provided during this visit. Depending on your insurance coverage, you may receive a charge related to this service.  I need to obtain your verbal consent now. Are you willing to proceed with your visit today? Zachary Burke has provided verbal consent on 07/21/2022 for a virtual visit (video or telephone). Inda Coke, [place]  Date: 07/21/2022 1:49 PM  Virtual Visit via Video Note   I, Inda Coke, connected with  Zachary Burke  ([id number], 27-Nov-1950) on 07/21/22 at  1:30 PM EDT by a video-enabled telemedicine application and verified that I am speaking with the correct person using two identifiers.  Location: Patient: Virtual Visit Location Patient:  Home Provider: Virtual Visit Location Provider: Home Office   I discussed the limitations of evaluation and management by telemedicine and the availability of in person appointments. The patient expressed understanding and agreed to proceed.    History of Present Illness: Zachary Burke is a 71 y.o. who identifies as a male who was assigned male at birth, and is being seen today for URI sx.  He was seen by PCP office on 07/11/22 for swimmer's ear. Was prescribed cortisporin drops and oral diflucan. He did not respond well to the cortisporin and was sent in acetic acid drops instead. He did not take the diflucan. He feels like his sx are worsening. He has had green nasal discharge, sinus pressure and worsening ear pain.  Denies: fever, chills, n/v/d, sick contacts, malaise, poor appetite   HPI: HPI  Problems:  Patient Active Problem List   Diagnosis Date Noted   Chronic cough 07/04/2022   Peripheral sensory neuropathy 04/25/2022   Bradycardia 04/25/2022   Seasonal allergic rhinitis due to pollen 02/28/2022   Nocturia more than twice per night 11/30/2021   Snorings 11/30/2021   Mild obstructive sleep apnea-hypopnea syndrome 11/30/2021   Chronic sinusitis 09/07/2021   Chronic bilateral low back pain without sciatica 01/31/2021   Vitamin D deficiency disease 07/13/2020   Erectile dysfunction due to arterial insufficiency 07/12/2020   Routine general medical examination at a health care facility 07/12/2020   Benign prostatic hyperplasia without lower urinary tract symptoms 07/12/2020   Hyperlipidemia LDL goal <130 07/12/2020   PUD (peptic  ulcer disease) 07/12/2020   Elevated systolic blood pressure reading without diagnosis of hypertension 07/12/2020   Esophageal reflux 08/21/2010   Personal history of colonic polyps 08/21/2010    Allergies: No Known Allergies Medications:  Current Outpatient Medications:    amoxicillin-clavulanate (AUGMENTIN) 400-57 MG/5ML suspension, Take 10 mLs  (800 mg total) by mouth 2 (two) times daily for 10 days., Disp: 200 mL, Rfl: 0   acetic acid 2 % otic solution, Place 4 drops into both ears 4 (four) times daily., Disp: 15 mL, Rfl: 0   Cholecalciferol 50 MCG (2000 UT) TABS, Take 1 tablet (2,000 Units total) by mouth daily., Disp: 90 tablet, Rfl: 1   fluconazole (DIFLUCAN) 150 MG tablet, Take 1 tablet (150 mg total) by mouth daily., Disp: 10 tablet, Rfl: 0   HYDROcodone bit-homatropine (HYCODAN) 5-1.5 MG/5ML syrup, Take 5 mLs by mouth every 8 (eight) hours as needed for cough. (Patient not taking: Reported on 07/11/2022), Disp: 120 mL, Rfl: 0   omeprazole (PRILOSEC) 40 MG capsule, Take 1 capsule (40 mg total) by mouth daily., Disp: 30 capsule, Rfl: 0   Probiotic Product (ALIGN) 4 MG CAPS, Take 1 capsule (4 mg total) by mouth daily., Disp: 30 capsule, Rfl: 1   psyllium (METAMUCIL) 58.6 % packet, Take 1 packet by mouth daily., Disp: , Rfl:    sucralfate (CARAFATE) 1 GM/10ML suspension, Take 10 mLs (1 g total) by mouth 4 (four) times daily -  with meals and at bedtime., Disp: 420 mL, Rfl: 0   tiZANidine (ZANAFLEX) 2 MG tablet, Take 1 tablet (2 mg total) by mouth every 8 (eight) hours as needed for muscle spasms., Disp: 90 tablet, Rfl: 0  Observations/Objective: Patient is well-developed, well-nourished in no acute distress.  Resting comfortably  at home.  Head is normocephalic, atraumatic.  No labored breathing.  Speech is clear and coherent with logical content.  Patient is alert and oriented at baseline.   Assessment and Plan: 1. Ear pain, bilateral Symptoms not improving with current treatment and now has sinus pressure/congestion No red flags on discussion Will trial oral augmentin -- he is requesting liquid suspension as he cannot tolerate swallowing pills Discussed that if sx continue to worsen or change, needs in office evaluation   Follow Up Instructions: I discussed the assessment and treatment plan with the patient. The patient was  provided an opportunity to ask questions and all were answered. The patient agreed with the plan and demonstrated an understanding of the instructions.  A copy of instructions were sent to the patient via MyChart unless otherwise noted below.   The patient was advised to call back or seek an in-person evaluation if the symptoms worsen or if the condition fails to improve as anticipated.  Time:  I spent 15 minutes with the patient via telehealth technology discussing the above problems/concerns.    Inda Coke, [place]

## 2022-07-21 NOTE — Patient Instructions (Signed)
It was great to see you!  Start oral augmentin  Push fluids and get plenty of rest. Please return if you are not improving as expected, or if you have high fevers (>101.5) or difficulty swallowing or worsening productive cough.  Call clinic with questions.  I hope you start feeling better soon!

## 2022-07-24 NOTE — Telephone Encounter (Signed)
Pt scheduled to come see Dr. Ronnald Ramp tomorrow at 8 am

## 2022-07-24 NOTE — Telephone Encounter (Signed)
Pt is reporting that ear has not gotten any better and has now flared up with higher temps. Ears are still feeling full and pt is wondering if there is anything else you recommend?

## 2022-07-25 ENCOUNTER — Encounter: Payer: Self-pay | Admitting: Internal Medicine

## 2022-07-25 ENCOUNTER — Ambulatory Visit (INDEPENDENT_AMBULATORY_CARE_PROVIDER_SITE_OTHER): Payer: Medicare HMO | Admitting: Internal Medicine

## 2022-07-25 VITALS — BP 122/80 | HR 67 | Temp 97.8°F | Ht 71.5 in | Wt 171.0 lb

## 2022-07-25 DIAGNOSIS — J301 Allergic rhinitis due to pollen: Secondary | ICD-10-CM

## 2022-07-25 DIAGNOSIS — H60333 Swimmer's ear, bilateral: Secondary | ICD-10-CM | POA: Diagnosis not present

## 2022-07-25 NOTE — Progress Notes (Signed)
Subjective:  Patient ID: Zachary Burke, male    DOB: 1951-06-15  Age: 71 y.o. MRN: [id number]  CC: Follow-up   HPI Zachary Burke presents for f/up -  He continues to complain of muffled sensation in both ears.  He was treated for otitis externa with Augmentin and fluconazole.  He decided not to take the fluconazole.  He is using acetic acid instead.  Outpatient Medications Prior to Visit  Medication Sig Dispense Refill   acetic acid 2 % otic solution Place 4 drops into both ears 4 (four) times daily. 15 mL 0   amoxicillin-clavulanate (AUGMENTIN) 400-57 MG/5ML suspension Take 10 mLs (800 mg total) by mouth 2 (two) times daily for 10 days. 200 mL 0   Cholecalciferol 50 MCG (2000 UT) TABS Take 1 tablet (2,000 Units total) by mouth daily. 90 tablet 1   HYDROcodone bit-homatropine (HYCODAN) 5-1.5 MG/5ML syrup Take 5 mLs by mouth every 8 (eight) hours as needed for cough. 120 mL 0   omeprazole (PRILOSEC) 40 MG capsule Take 1 capsule (40 mg total) by mouth daily. 30 capsule 0   Probiotic Product (ALIGN) 4 MG CAPS Take 1 capsule (4 mg total) by mouth daily. 30 capsule 1   psyllium (METAMUCIL) 58.6 % packet Take 1 packet by mouth daily.     sucralfate (CARAFATE) 1 GM/10ML suspension Take 10 mLs (1 g total) by mouth 4 (four) times daily -  with meals and at bedtime. 420 mL 0   tiZANidine (ZANAFLEX) 2 MG tablet Take 1 tablet (2 mg total) by mouth every 8 (eight) hours as needed for muscle spasms. 90 tablet 0   fluconazole (DIFLUCAN) 150 MG tablet Take 1 tablet (150 mg total) by mouth daily. 10 tablet 0   No facility-administered medications prior to visit.    ROS Review of Systems  Constitutional: Negative.  Negative for diaphoresis and fatigue.  HENT:  Positive for ear pain. Negative for congestion, facial swelling, sinus pressure, sore throat and trouble swallowing.   Eyes: Negative.   Respiratory:  Negative for cough, chest tightness, shortness of breath and wheezing.   Cardiovascular:   Negative for chest pain, palpitations and leg swelling.  Gastrointestinal:  Negative for abdominal pain.  Endocrine: Negative.   Genitourinary: Negative.  Negative for difficulty urinating.  Musculoskeletal: Negative.   Skin: Negative.   Neurological:  Negative for dizziness and weakness.  Hematological:  Negative for adenopathy. Does not bruise/bleed easily.  Psychiatric/Behavioral: Negative.      Objective:  BP 122/80 (BP Location: Left Arm, Patient Position: Sitting, Cuff Size: Large)   Pulse 67   Temp 97.8 F (36.6 C) (Oral)   Ht 5' 11.5" (1.816 m)   Wt 171 lb (77.6 kg)   SpO2 99%   BMI 23.52 kg/m   BP Readings from Last 3 Encounters:  07/27/22 130/80  07/25/22 122/80  07/11/22 122/78    Wt Readings from Last 3 Encounters:  07/27/22 171 lb (77.6 kg)  07/25/22 171 lb (77.6 kg)  07/11/22 171 lb (77.6 kg)    Physical Exam Vitals reviewed.  HENT:     Right Ear: Hearing and external ear normal. No middle ear effusion.     Left Ear: Hearing and external ear normal.  No middle ear effusion.     Ears:     Comments: Both EACs are coated with a filmy white substance.  The tympanic membranes are coated with a crystalline white substance.    Nose: Nose normal.     Mouth/Throat:  Mouth: Mucous membranes are moist.  Eyes:     General: No scleral icterus.    Conjunctiva/sclera: Conjunctivae normal.  Cardiovascular:     Rate and Rhythm: Normal rate and regular rhythm.     Heart sounds: No murmur heard. Pulmonary:     Effort: Pulmonary effort is normal.     Breath sounds: No stridor. No wheezing, rhonchi or rales.  Abdominal:     General: Abdomen is flat.     Palpations: There is no mass.     Tenderness: There is no abdominal tenderness. There is no guarding.     Hernia: No hernia is present.  Musculoskeletal:        General: Normal range of motion.     Cervical back: Neck supple.     Right lower leg: No edema.     Left lower leg: No edema.  Lymphadenopathy:      Cervical: No cervical adenopathy.  Skin:    General: Skin is warm and dry.     Findings: No rash.  Neurological:     General: No focal deficit present.     Mental Status: He is alert.     Lab Results  Component Value Date   WBC 5.2 04/25/2022   HGB 15.5 04/25/2022   HCT 47.4 04/25/2022   PLT 210.0 04/25/2022   GLUCOSE 73 04/25/2022   CHOL 134 10/11/2021   TRIG 83.0 10/11/2021   HDL 40.10 10/11/2021   LDLCALC 77 10/11/2021   ALT 15 10/11/2021   AST 23 10/11/2021   NA 138 04/25/2022   K 4.5 04/25/2022   CL 102 04/25/2022   CREATININE 0.93 04/25/2022   BUN 19 04/25/2022   CO2 29 04/25/2022   TSH 2.04 04/25/2022   PSA 1.49 10/11/2021   HGBA1C 5.5 07/12/2020    US PELVIS LIMITED (TRANSABDOMINAL ONLY)  Result Date: 04/09/2022 CLINICAL DATA:  Right groin tenderness history of inguinal hernia repair EXAM: LIMITED ULTRASOUND OF PELVIS TECHNIQUE: Limited transabdominal ultrasound examination of the pelvis was performed. COMPARISON:  None Available. FINDINGS: Targeted ultrasound of the right groin performed with and without Valsalva. Additional sonography of right umbilical region performed in the region of concern. Hypoechoic area over the right groin, question mesh material related to prior hernia repair. No definite bowel containing hernia seen in the right groin. No definite sonographic correlate for right periumbilical discomfort. IMPRESSION: 1. Hypoechoic area with shadowing in the right groin in the region of concern, question mesh material related to prior hernia repair. No definite bowel containing right groin hernia, CT could be considered for further evaluation as indicated 2. No definite sonographic correlate for right periumbilical tenderness, recommend clinical management. Electronically Signed   By: Donavan Foil M.D.   On: 04/09/2022 23:34    Assessment & Plan:   Zachary Burke was seen today for follow-up.  Diagnoses and all orders for this visit:  Seasonal allergic  rhinitis due to pollen- He prefers not to treat this.  Acute swimmer's ear of both sides - Improvement noted.   I have discontinued Zachary Burke's fluconazole. I am also having him maintain his psyllium, Cholecalciferol, tiZANidine, Align, HYDROcodone bit-homatropine, sucralfate, omeprazole, acetic acid, and amoxicillin-clavulanate.  No orders of the defined types were placed in this encounter.    Follow-up: No follow-ups on file.  Scarlette Calico, MD

## 2022-07-27 ENCOUNTER — Encounter: Payer: Self-pay | Admitting: Pulmonary Disease

## 2022-07-27 ENCOUNTER — Ambulatory Visit: Payer: Medicare HMO | Admitting: Pulmonary Disease

## 2022-07-27 VITALS — BP 130/80 | HR 60 | Ht 71.5 in | Wt 171.0 lb

## 2022-07-27 DIAGNOSIS — R6889 Other general symptoms and signs: Secondary | ICD-10-CM | POA: Diagnosis not present

## 2022-07-27 DIAGNOSIS — J301 Allergic rhinitis due to pollen: Secondary | ICD-10-CM

## 2022-07-27 DIAGNOSIS — G4733 Obstructive sleep apnea (adult) (pediatric): Secondary | ICD-10-CM

## 2022-07-27 DIAGNOSIS — R053 Chronic cough: Secondary | ICD-10-CM | POA: Diagnosis not present

## 2022-07-27 DIAGNOSIS — R0600 Dyspnea, unspecified: Secondary | ICD-10-CM

## 2022-07-27 NOTE — Patient Instructions (Addendum)
I am concerned you have underlying COPD   We will check pulmonary function tests before you leave for your trip on 9/22 and discuss the results  Follow up in 3 months

## 2022-07-27 NOTE — Progress Notes (Addendum)
Synopsis: Referred in September 2023 for chronic cough  Subjective:   PATIENT ID: Zachary Burke GENDER: male DOB: February 19, 1951, MRN: [id number]  HPI  Chief Complaint  Patient presents with   Consult    SOB   Zachary Burke is a 71 year old man, never smoker with hiatal hernia and GERD who is referred to pulmonary clinic for cough and shortness of breath.   Patient reports issues with heat intolerance over recent years. 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 He notes having trouble being active in temperatures over 70 degrees where he experiences significant fatigue, shortness of breath, weakness, heart racing and often feels like he is in a dream like state. During these episodes he reports a blood pressure in the 140s/90s, HR 70s and O2 saturation 93%. Rest with deep breaths and eating nuts/bananas along with hydrating helps him recover.   He had lyme disease in 1996 and 2003 treated with doxycyline. He had ENT surgery with Dr. Redmond Baseman with removal of fungal balls from sinuses and correction of deviated septum in 2006.   He is a never smoker. He grew up with significant second hand smoke. 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 Denies significant camp fire smoke exposure. He does wood working as a hobby and uses a English as a second language teacher. No pets at home.   Past Medical History:  Diagnosis Date   Basal cell carcinoma    Colon polyps    2002,2006   Eczema    GERD (gastroesophageal reflux disease)    Hiatal hernia      Family History  Problem Relation Age of Onset   COPD Mother    Colon cancer Paternal Aunt    Prostate cancer Paternal Uncle    Esophageal cancer Paternal Grandfather      Social History   Socioeconomic History   Marital status: Married    Spouse name: Benjamine Mola   Number of children: 3   Years of education: Not on file   Highest education level: Not on file  Occupational  History   Occupation: Scientist, clinical (histocompatibility and immunogenetics): [employer]  Tobacco Use   Smoking status: Never    Passive exposure: Past   Smokeless tobacco: Never  Vaping Use   Vaping Use: Never used  Substance and Sexual Activity   Alcohol use: Yes   Drug use: No   Sexual activity: Not on file  Other Topics Concern   Not on file  Social History Narrative   Lives with wife   Right handed   Caffeine: half a cup of coffee every morning   Social Determinants of Health   Financial Resource Strain: Low Risk  (08/29/2021)   Overall Financial Resource Strain (CARDIA)    Difficulty of Paying Living Expenses: Not hard at all  Food Insecurity: No Food Insecurity (08/29/2021)   Hunger Vital Sign    Worried About Running Out of Food in the Last Year: Never true    [place] in the Last Year: Never true  Transportation Needs: No Transportation Needs (08/29/2021)   PRAPARE - Hydrologist (Medical): No    Lack of Transportation (Non-Medical): No  Physical Activity: Sufficiently Active (08/29/2021)   Exercise Vital Sign    Days of Exercise per Week: 7 days    Minutes of Exercise per Session: 60 min  Stress: No Stress Concern Present (08/29/2021)  [place] Questionnaire    Feeling of Stress : Not at all  Social Connections: Socially Integrated (08/29/2021)   Social Connection and Isolation Panel [NHANES]    Frequency of Communication with Friends and Family: More than three times a week    Frequency of Social Gatherings with Friends and Family: More than three times a week    Attends Religious Services: More than 4 times per year    Active Member of [employer] or Organizations: Yes    Attends Music therapist: More than 4 times per year    Marital Status: Married  Human resources officer Violence: Not At Risk (08/29/2021)   Humiliation, Afraid, Rape, and Kick questionnaire    Fear of Current or Ex-Partner:  No    Emotionally Abused: No    Physically Abused: No    Sexually Abused: No     No Known Allergies   Outpatient Medications Prior to Visit  Medication Sig Dispense Refill   acetic acid 2 % otic solution Place 4 drops into both ears 4 (four) times daily. 15 mL 0   amoxicillin-clavulanate (AUGMENTIN) 400-57 MG/5ML suspension Take 10 mLs (800 mg total) by mouth 2 (two) times daily for 10 days. 200 mL 0   Cholecalciferol 50 MCG (2000 UT) TABS Take 1 tablet (2,000 Units total) by mouth daily. 90 tablet 1   HYDROcodone bit-homatropine (HYCODAN) 5-1.5 MG/5ML syrup Take 5 mLs by mouth every 8 (eight) hours as needed for cough. 120 mL 0   omeprazole (PRILOSEC) 40 MG capsule Take 1 capsule (40 mg total) by mouth daily. 30 capsule 0   Probiotic Product (ALIGN) 4 MG CAPS Take 1 capsule (4 mg total) by mouth daily. 30 capsule 1   psyllium (METAMUCIL) 58.6 % packet Take 1 packet by mouth daily.     sucralfate (CARAFATE) 1 GM/10ML suspension Take 10 mLs (1 g total) by mouth 4 (four) times daily -  with meals and at bedtime. 420 mL 0   tiZANidine (ZANAFLEX) 2 MG tablet Take 1 tablet (2 mg total) by mouth every 8 (eight) hours as needed for muscle spasms. 90 tablet 0   No facility-administered medications prior to visit.    Review of Systems  Constitutional:  Positive for malaise/fatigue. Negative for chills, fever and weight loss.  HENT:  Negative for congestion, sinus pain and sore throat.   Eyes: Negative.   Respiratory:  Positive for cough and shortness of breath. Negative for hemoptysis, sputum production and wheezing.   Cardiovascular:  Negative for chest pain, palpitations, orthopnea, claudication and leg swelling.  Gastrointestinal:  Negative for abdominal pain, heartburn, nausea and vomiting.  Genitourinary: Negative.   Musculoskeletal:  Negative for joint pain and myalgias.  Skin:  Negative for rash.  Neurological:  Positive for weakness.  Endo/Heme/Allergies: Negative.    Psychiatric/Behavioral: Negative.     Objective:   Vitals:   07/27/22 0923  BP: 130/80  Pulse: 60  SpO2: 99%  Weight: 171 lb (77.6 kg)  Height: 5' 11.5" (1.816 m)     Physical Exam Constitutional:      General: He is not in acute distress. HENT:     Head: Normocephalic and atraumatic.  Eyes:     Extraocular Movements: Extraocular movements intact.     Conjunctiva/sclera: Conjunctivae normal.     Pupils: Pupils are equal, round, and reactive to light.  Cardiovascular:     Rate and Rhythm: Normal rate and regular rhythm.     Pulses:  Normal pulses.     Heart sounds: Normal heart sounds. No murmur heard. Pulmonary:     Effort: Pulmonary effort is normal. Prolonged expiration present.     Breath sounds: Normal breath sounds.  Abdominal:     General: Bowel sounds are normal.     Palpations: Abdomen is soft.  Musculoskeletal:     Right lower leg: No edema.     Left lower leg: No edema.  Lymphadenopathy:     Cervical: No cervical adenopathy.  Skin:    General: Skin is warm and dry.  Neurological:     General: No focal deficit present.     Mental Status: He is alert.  Psychiatric:        Mood and Affect: Mood normal.        Behavior: Behavior normal.        Thought Content: Thought content normal.        Judgment: Judgment normal.    CBC    Component Value Date/Time   WBC 5.2 04/25/2022 1011   RBC 5.32 04/25/2022 1011   HGB 15.5 04/25/2022 1011   HCT 47.4 04/25/2022 1011   PLT 210.0 04/25/2022 1011   MCV 89.1 04/25/2022 1011   MCH 29.4 07/12/2020 1107   MCHC 32.6 04/25/2022 1011   RDW 14.6 04/25/2022 1011   LYMPHSABS 1.4 04/25/2022 1011   MONOABS 0.6 04/25/2022 1011   EOSABS 0.2 04/25/2022 1011   BASOSABS 0.0 04/25/2022 1011      Latest Ref Rng & Units 04/25/2022   10:11 AM 07/04/2021   11:57 AM 07/12/2020   11:07 AM  BMP  Glucose 70 - 99 mg/dL 73  86  97   BUN 6 - 23 mg/dL '19  16  16   '$ Creatinine 0.40 - 1.50 mg/dL 0.93  1.02  1.08   BUN/Creat Ratio 6  - 22 (calc)   NOT APPLICABLE   Sodium 161 - 145 mEq/L 138  137  138   Potassium 3.5 - 5.1 mEq/L 4.5  4.8  4.7   Chloride 96 - 112 mEq/L 102  100  102   CO2 19 - 32 mEq/L '29  31  28   '$ Calcium 8.4 - 10.5 mg/dL 9.8  9.9  9.5    Chest imaging: CXR 08/30/18 Lung volumes are normal. No consolidative airspace disease. No pleural effusions. No pneumothorax. No pulmonary nodule or mass noted. Pulmonary vasculature and the cardiomediastinal silhouette are within normal limits.  PFT:     No data to display          Labs:  Path:  Echo:  Heart Catheterization:  Exercise stress test 05/24/22   The ECG was negative for ischemia   Exercise capacity was excellent. Patient exercised for 9 min and 0 sec. Maximum HR of 134 bpm. MPHR 89.0 %. Peak METS 10.1 .   Normal blood pressure response noted during stress. Heart rate recovery was normal.   No ST deviation was noted.  Assessment & Plan:   Dyspnea, unspecified type - Plan: Pulmonary Function Test  Chronic cough  Mild obstructive sleep apnea-hypopnea syndrome  Seasonal allergic rhinitis due to pollen  Heat intolerance  Discussion: Zachary Burke is a 71 year old man, never smoker with hiatal hernia and GERD who is referred to pulmonary clinic for cough and shortness of breath.  Patient has rising serum bicarb rates over recent years concerning for CO2 retention along with prolonged expiratory phase on exam concerning for underlying obstructive lung disease. Risk factors include  significant second hand smoke exposure.   We will obtain pulmonary function tests and determine need for inhaler therapy.  He does have moderate sleep apnea when sleeping on his back. He mainly sleeps on his side. Positional management for his OSA is ok for now. Will consider trial of CPAP therapy in future should he continue to have these episodes of dyspnea with heat intolerance.   We can consider CPET testing in the future if PFTs are unrevealing.  It  would be helpful if he can take his temperature during these episodes to determine if he has abnormal thermoregulation of his body.  Follow up in 3 months.  Freda Jackson, MD [facility] Office: [phone]   Current Outpatient Medications:    acetic acid 2 % otic solution, Place 4 drops into both ears 4 (four) times daily., Disp: 15 mL, Rfl: 0   amoxicillin-clavulanate (AUGMENTIN) 400-57 MG/5ML suspension, Take 10 mLs (800 mg total) by mouth 2 (two) times daily for 10 days., Disp: 200 mL, Rfl: 0   Cholecalciferol 50 MCG (2000 UT) TABS, Take 1 tablet (2,000 Units total) by mouth daily., Disp: 90 tablet, Rfl: 1   HYDROcodone bit-homatropine (HYCODAN) 5-1.5 MG/5ML syrup, Take 5 mLs by mouth every 8 (eight) hours as needed for cough., Disp: 120 mL, Rfl: 0   omeprazole (PRILOSEC) 40 MG capsule, Take 1 capsule (40 mg total) by mouth daily., Disp: 30 capsule, Rfl: 0   Probiotic Product (ALIGN) 4 MG CAPS, Take 1 capsule (4 mg total) by mouth daily., Disp: 30 capsule, Rfl: 1   psyllium (METAMUCIL) 58.6 % packet, Take 1 packet by mouth daily., Disp: , Rfl:    sucralfate (CARAFATE) 1 GM/10ML suspension, Take 10 mLs (1 g total) by mouth 4 (four) times daily -  with meals and at bedtime., Disp: 420 mL, Rfl: 0   tiZANidine (ZANAFLEX) 2 MG tablet, Take 1 tablet (2 mg total) by mouth every 8 (eight) hours as needed for muscle spasms., Disp: 90 tablet, Rfl: 0

## 2022-07-28 ENCOUNTER — Encounter: Payer: Self-pay | Admitting: Pulmonary Disease

## 2022-07-29 DIAGNOSIS — H60333 Swimmer's ear, bilateral: Secondary | ICD-10-CM | POA: Insufficient documentation

## 2022-07-30 ENCOUNTER — Ambulatory Visit (INDEPENDENT_AMBULATORY_CARE_PROVIDER_SITE_OTHER): Payer: Medicare HMO | Admitting: Pulmonary Disease

## 2022-07-30 DIAGNOSIS — R0600 Dyspnea, unspecified: Secondary | ICD-10-CM

## 2022-07-30 LAB — PULMONARY FUNCTION TEST
FEF 25-75 Post: 3.63 L/sec
FEF 25-75 Pre: 3.5 L/sec
FEF2575-%Change-Post: 3 %
FEF2575-%Pred-Post: 133 %
FEF2575-%Pred-Pre: 129 %
FEV1-%Change-Post: 2 %
FEV1-%Pred-Post: 124 %
FEV1-%Pred-Pre: 121 %
FEV1-Post: 4.44 L
FEV1-Pre: 4.34 L
FEV1FVC-%Change-Post: 3 %
FEV1FVC-%Pred-Pre: 100 %
FEV6-%Change-Post: -1 %
FEV6-%Pred-Post: 124 %
FEV6-%Pred-Pre: 126 %
FEV6-Post: 5.73 L
FEV6-Pre: 5.8 L
FEV6FVC-%Change-Post: 0 %
FEV6FVC-%Pred-Post: 104 %
FEV6FVC-%Pred-Pre: 104 %
FVC-%Change-Post: -1 %
FVC-%Pred-Post: 119 %
FVC-%Pred-Pre: 121 %
FVC-Post: 5.8 L
FVC-Pre: 5.88 L
Post FEV1/FVC ratio: 77 %
Post FEV6/FVC ratio: 99 %
Pre FEV1/FVC ratio: 74 %
Pre FEV6/FVC Ratio: 99 %

## 2022-07-30 NOTE — Patient Instructions (Signed)
Pre/Post Spirometry Performed Today. 

## 2022-07-30 NOTE — Progress Notes (Signed)
Pre/Post Spirometry Performed Today. 

## 2022-08-04 ENCOUNTER — Encounter: Payer: Self-pay | Admitting: Internal Medicine

## 2022-08-07 ENCOUNTER — Telehealth: Payer: Self-pay | Admitting: Pulmonary Disease

## 2022-08-07 DIAGNOSIS — R0600 Dyspnea, unspecified: Secondary | ICD-10-CM

## 2022-08-07 DIAGNOSIS — L570 Actinic keratosis: Secondary | ICD-10-CM | POA: Diagnosis not present

## 2022-08-07 DIAGNOSIS — Z85828 Personal history of other malignant neoplasm of skin: Secondary | ICD-10-CM | POA: Diagnosis not present

## 2022-08-08 NOTE — Telephone Encounter (Signed)
Freddi Starr, MD  08/06/2022  5:27 PM EDT     Please let patient know that his spirometry is within normal limits. I would recommend we schedule him for a cardiopulmonary exercise test in the next steps of the workup as well as a chest x-ray. Please order these if he is ok with having them done. They may need to be done after his upcoming trip.   Thanks, JD   Called and spoke with pt letting him know the results of the PFT and he verbalized understanding. Nothing further needed.

## 2022-08-22 ENCOUNTER — Encounter (HOSPITAL_COMMUNITY): Payer: Medicare HMO

## 2022-08-27 ENCOUNTER — Telehealth: Payer: Self-pay | Admitting: Internal Medicine

## 2022-08-27 NOTE — Telephone Encounter (Signed)
Left message for patient to call back to schedule Medicare Annual Wellness Visit   Last AWV  08/29/21  Please schedule at anytime with [facility] if patient calls the office back.     Any questions, please call me at [phone]

## 2022-08-28 ENCOUNTER — Ambulatory Visit (HOSPITAL_COMMUNITY): Payer: Medicare HMO | Attending: Pulmonary Disease

## 2022-08-28 DIAGNOSIS — L309 Dermatitis, unspecified: Secondary | ICD-10-CM | POA: Insufficient documentation

## 2022-08-28 DIAGNOSIS — K219 Gastro-esophageal reflux disease without esophagitis: Secondary | ICD-10-CM | POA: Insufficient documentation

## 2022-08-28 DIAGNOSIS — R0600 Dyspnea, unspecified: Secondary | ICD-10-CM | POA: Insufficient documentation

## 2022-08-28 DIAGNOSIS — K635 Polyp of colon: Secondary | ICD-10-CM | POA: Diagnosis not present

## 2022-08-28 DIAGNOSIS — K449 Diaphragmatic hernia without obstruction or gangrene: Secondary | ICD-10-CM | POA: Diagnosis not present

## 2022-08-28 DIAGNOSIS — C4491 Basal cell carcinoma of skin, unspecified: Secondary | ICD-10-CM | POA: Diagnosis not present

## 2022-08-29 DIAGNOSIS — C44311 Basal cell carcinoma of skin of nose: Secondary | ICD-10-CM | POA: Diagnosis not present

## 2022-08-29 DIAGNOSIS — D485 Neoplasm of uncertain behavior of skin: Secondary | ICD-10-CM | POA: Diagnosis not present

## 2022-08-29 DIAGNOSIS — C44219 Basal cell carcinoma of skin of left ear and external auricular canal: Secondary | ICD-10-CM | POA: Diagnosis not present

## 2022-08-29 DIAGNOSIS — L570 Actinic keratosis: Secondary | ICD-10-CM | POA: Diagnosis not present

## 2022-08-30 ENCOUNTER — Ambulatory Visit: Payer: Medicare HMO | Admitting: Family Medicine

## 2022-08-31 ENCOUNTER — Encounter (HOSPITAL_COMMUNITY): Payer: Medicare HMO

## 2022-10-04 ENCOUNTER — Telehealth: Payer: Medicare HMO | Admitting: Family Medicine

## 2022-10-04 ENCOUNTER — Telehealth: Payer: Self-pay | Admitting: Gastroenterology

## 2022-10-04 DIAGNOSIS — H9203 Otalgia, bilateral: Secondary | ICD-10-CM

## 2022-10-04 NOTE — Telephone Encounter (Signed)
Inbound call from patient calling to schedule appt from a referral we received from his pcp. He has a recall colon in with Dr.Danis for 2021 that has not been scheduled. The patient informed me his father died of esophageal cancer and he would like to have both colon and endo done. Is this something I can schedule directly or does the patient need an OV to discuss? Please advise.

## 2022-10-04 NOTE — Progress Notes (Signed)
Narka   Please follow up with PCP tomorrow for someone to look in ears. Questionable fungal infection/swimmers ear. NO URI S&S Acetic acid has not helped in most recent infection. Did take oral abx to help in Sept. Daily swimmer.  Encouraged warm compresses, cotton in ears if cold and temp trouble. Tylenol as needed.  Declined Prednisone and NSAID use.  Patient acknowledged agreement and understanding of the plan.

## 2022-10-05 ENCOUNTER — Ambulatory Visit (INDEPENDENT_AMBULATORY_CARE_PROVIDER_SITE_OTHER): Payer: Medicare HMO | Admitting: Family Medicine

## 2022-10-05 ENCOUNTER — Encounter: Payer: Self-pay | Admitting: Family Medicine

## 2022-10-05 VITALS — BP 128/80 | HR 67 | Temp 97.6°F | Ht 71.5 in | Wt 172.0 lb

## 2022-10-05 DIAGNOSIS — H60331 Swimmer's ear, right ear: Secondary | ICD-10-CM | POA: Diagnosis not present

## 2022-10-05 DIAGNOSIS — Z8619 Personal history of other infectious and parasitic diseases: Secondary | ICD-10-CM

## 2022-10-05 MED ORDER — FLUCONAZOLE 150 MG PO TABS: 150.0000 mg | ORAL_TABLET | Freq: Every day | ORAL | 0 refills | Status: DC

## 2022-10-05 NOTE — Patient Instructions (Addendum)
Take the oral fluconazole once daily x 7 days. Use a topical hydrocortisone just on the ear canal and not deep in the ear (you can apply it with your pinky finger and then wash your hands).   Hold off on the acetic acid drops.   Follow up in 1 week.    Otitis Externa  Otitis externa is an infection of the outer ear canal. The outer ear canal is the area between the outside of the ear and the eardrum. Otitis externa is sometimes called swimmer's ear. What are the causes? Common causes of this condition include: Swimming in dirty water. Moisture in the ear. An injury to the inside of the ear. An object stuck in the ear. A cut or scrape on the outside of the ear or in the ear canal. What increases the risk? You are more likely to get this condition if you go swimming often. What are the signs or symptoms? Itching in the ear. This is often the first symptom. Swelling of the ear. Redness in the ear. Ear pain. The pain may get worse when you pull on your ear. Pus coming from the ear. How is this treated? This condition may be treated with: Antibiotic ear drops. These are often given for 10-14 days. Medicines to reduce itching and swelling. Follow these instructions at home: If you were prescribed antibiotic ear drops, use them as told by your doctor. Do not stop using them even if you start to feel better. Take over-the-counter and prescription medicines only as told by your doctor. Avoid getting water in your ears as told by your doctor. You may be told to avoid swimming or water sports for a few days. Keep all follow-up visits. How is this prevented? Keep your ears dry. Use the corner of a towel to dry your ears after you swim or bathe. Try not to scratch or put things in your ear. Doing these things makes it easier for germs to grow in your ear. Avoid swimming in lakes, dirty water, or swimming pools that may not have the right amount of a chemical called chlorine. Contact a doctor  if: You have a fever. Your ear is still red, swollen, or painful after 3 days. You still have pus coming from your ear after 3 days. Your redness, swelling, or pain gets worse. You have a very bad headache. Get help right away if: You have redness, swelling, and pain or tenderness behind your ear. Summary Otitis externa is an infection of the outer ear canal. Symptoms include pain, redness, and swelling of the ear. If you were prescribed antibiotic ear drops, use them as told by your doctor. Do not stop using them even if you start to feel better. Try not to scratch or put things in your ear. This information is not intended to replace advice given to you by your health care provider. Make sure you discuss any questions you have with your health care provider. Document Revised: 01/18/2021 Document Reviewed: 01/18/2021 Elsevier Patient Education  [place].

## 2022-10-05 NOTE — Progress Notes (Signed)
Subjective:  Zachary Burke is a 71 y.o. male who presents for right ear itching and discomfort. 09/20/2022 he had a cold with usual cold symptoms.  URI symptoms have resolved except for his right ear.  History of otitis media.  He swims most days.  Completed Augmentin. Using acetic acid.  Cortisporin drops did not help.  He was prescribed diflucan but did not take it.   Reports hx of fungus in sinuses.   Denies fever, chills, headache, dizziness, sinus pain, sore throat, cough, abdominal pain, nausea, vomiting or diarrhea.  No other aggravating or relieving factors.  No other c/o.  ROS as in subjective.   Objective: Vitals:   10/05/22 0934  BP: 128/80  Pulse: 67  Temp: 97.6 F (36.4 C)  SpO2: 98%    General appearance: Alert, WD/WN, no distress, well appearing                             Skin: warm, no rash                           Head: no sinus tenderness                            Eyes: conjunctiva normal, corneas clear, PERRLA                            Ears: Bilateral TMs normal-appearing, left ear canal normal.  Right ear canal with edema, tenderness and irritation.  Some maceration noted along with a yellowish fluid.                          Nose: septum midline, no drainage             Mouth/throat: MMM, tongue normal, mild pharyngeal erythema                           Neck: supple, no adenopathy, no thyromegaly, nontender                            Assessment: Acute swimmer's ear of right side - Plan: fluconazole (DIFLUCAN) 150 MG tablet  History of fungal infection - Plan: fluconazole (DIFLUCAN) 150 MG tablet   Plan: Hold off on acetic acid drops for now.  Oral fluconazole prescribed.  He may also use a topical hydrocortisone to external canal for itching and irritation.  Follow-up in 1 week or sooner if needed.

## 2022-10-10 ENCOUNTER — Encounter: Payer: Self-pay | Admitting: Family Medicine

## 2022-10-10 ENCOUNTER — Telehealth (INDEPENDENT_AMBULATORY_CARE_PROVIDER_SITE_OTHER): Payer: Medicare HMO | Admitting: Family Medicine

## 2022-10-10 DIAGNOSIS — J019 Acute sinusitis, unspecified: Secondary | ICD-10-CM

## 2022-10-10 MED ORDER — AMOXICILLIN-POT CLAVULANATE 600-42.9 MG/5ML PO SUSR: 600.0000 mg | Freq: Three times a day (TID) | ORAL | 0 refills | Status: DC

## 2022-10-10 NOTE — Telephone Encounter (Signed)
Called pt and made VV for today

## 2022-10-10 NOTE — Progress Notes (Signed)
MyChart Video Visit    Virtual Visit via Video Note   This visit type was conducted due to national recommendations for restrictions regarding the COVID-19 Pandemic (e.g. social distancing) in an effort to limit this patient's exposure and mitigate transmission in our community. This patient is at least at moderate risk for complications without adequate follow up. This format is felt to be most appropriate for this patient at this time. Physical exam was limited by quality of the video and audio technology used for the visit. CMA was able to get the patient set up on a video visit.  Patient location: Home. Patient and provider in visit Provider location: Office  I discussed the limitations of evaluation and management by telemedicine and the availability of in person appointments. The patient expressed understanding and agreed to proceed.  Visit Date: 10/10/2022  Today's healthcare provider: Harland Dingwall, NP-C     Subjective:    Patient ID: Zachary Burke, male    DOB: 1951/02/20, 71 y.o.   MRN: [id number]  Chief Complaint  Patient presents with   Sinus Problem    HPI  C/o thick purulent nasal drainage, congestion, and dull headache  Using nasal rinses and Tylenol.  Recent swimmers ear and improving.   No fever, chills, dizziness, shortness of breath, N/V/D.     Past Medical History:  Diagnosis Date   Basal cell carcinoma    Colon polyps    2002,2006   Eczema    GERD (gastroesophageal reflux disease)    Hiatal hernia     Past Surgical History:  Procedure Laterality Date   HERNIA REPAIR  2019   NASAL SEPTUM SURGERY      Family History  Problem Relation Age of Onset   COPD Mother    Colon cancer Paternal Aunt    Prostate cancer Paternal Uncle    Esophageal cancer Paternal Grandfather     Social History   Socioeconomic History   Marital status: Married    Spouse name: Benjamine Mola   Number of children: 3   Years of education: Not on file   Highest  education level: Not on file  Occupational History   Occupation: Scientist, clinical (histocompatibility and immunogenetics): [employer]  Tobacco Use   Smoking status: Never    Passive exposure: Past   Smokeless tobacco: Never  Vaping Use   Vaping Use: Never used  Substance and Sexual Activity   Alcohol use: Yes   Drug use: No   Sexual activity: Not on file  Other Topics Concern   Not on file  Social History Narrative   Lives with wife   Right handed   Caffeine: half a cup of coffee every morning   Social Determinants of Health   Financial Resource Strain: Low Risk  (08/29/2021)   Overall Financial Resource Strain (CARDIA)    Difficulty of Paying Living Expenses: Not hard at all  Food Insecurity: No Food Insecurity (08/29/2021)   Hunger Vital Sign    Worried About Running Out of Food in the Last Year: Never true    Ran Out of Food in the Last Year: Never true  Transportation Needs: No Transportation Needs (08/29/2021)   PRAPARE - Hydrologist (Medical): No    Lack of Transportation (Non-Medical): No  Physical Activity: Sufficiently Active (08/29/2021)   Exercise Vital Sign    Days of Exercise per Week: 7 days    Minutes of Exercise per Session: 60 min  Stress: No  Stress Concern Present (08/29/2021)   [place]    Feeling of Stress : Not at all  Social Connections: [place] (08/29/2021)   Social Connection and Isolation Panel [NHANES]    Frequency of Communication with Friends and Family: More than three times a week    Frequency of Social Gatherings with Friends and Family: More than three times a week    Attends Religious Services: More than 4 times per year    Active Member of [employer] or Organizations: Yes    Attends Music therapist: More than 4 times per year    Marital Status: Married  Human resources officer Violence: Not At Risk (08/29/2021)   Humiliation, Afraid, Rape, and Kick  questionnaire    Fear of Current or Ex-Partner: No    Emotionally Abused: No    Physically Abused: No    Sexually Abused: No    Outpatient Medications Prior to Visit  Medication Sig Dispense Refill   acetic acid 2 % otic solution Place 4 drops into both ears 4 (four) times daily. 15 mL 0   Cholecalciferol 50 MCG (2000 UT) TABS Take 1 tablet (2,000 Units total) by mouth daily. 90 tablet 1   fluconazole (DIFLUCAN) 150 MG tablet Take 1 tablet (150 mg total) by mouth daily. 7 tablet 0   omeprazole (PRILOSEC) 40 MG capsule Take 1 capsule (40 mg total) by mouth daily. 30 capsule 0   Probiotic Product (ALIGN) 4 MG CAPS Take 1 capsule (4 mg total) by mouth daily. 30 capsule 1   psyllium (METAMUCIL) 58.6 % packet Take 1 packet by mouth daily.     sucralfate (CARAFATE) 1 GM/10ML suspension Take 10 mLs (1 g total) by mouth 4 (four) times daily -  with meals and at bedtime. 420 mL 0   tiZANidine (ZANAFLEX) 2 MG tablet Take 1 tablet (2 mg total) by mouth every 8 (eight) hours as needed for muscle spasms. 90 tablet 0   No facility-administered medications prior to visit.    No Known Allergies  ROS     Objective:    Physical Exam  There were no vitals taken for this visit. Wt Readings from Last 3 Encounters:  10/05/22 172 lb (78 kg)  07/27/22 171 lb (77.6 kg)  07/25/22 171 lb (77.6 kg)   Alert and oriented and in no acute distress.  Respirations unlabored.    Assessment & Plan:   Problem List Items Addressed This Visit   None Visit Diagnoses     Acute sinusitis, recurrence not specified, unspecified location    -  Primary   Relevant Medications   amoxicillin-clavulanate (AUGMENTIN) 600-42.9 MG/5ML suspension      Augmentin suspension prescribed.  This has worked well for him in the past.  Discussed symptomatic management.  Follow-up if worsening or not back to baseline when he completes the antibiotic.  I am having Ruben Gottron start on amoxicillin-clavulanate. I am also  having him maintain his psyllium, Cholecalciferol, tiZANidine, Align, sucralfate, omeprazole, acetic acid, and fluconazole.  Meds ordered this encounter  Medications   amoxicillin-clavulanate (AUGMENTIN) 600-42.9 MG/5ML suspension    Sig: Take 5 mLs (600 mg total) by mouth 3 (three) times daily. Take with meals x 10 days.    Dispense:  200 mL    Refill:  0    Order Specific Question:   Supervising Provider    Answer:   Pricilla Holm A [[id number]]    I discussed the assessment and  treatment plan with the patient. The patient was provided an opportunity to ask questions and all were answered. The patient agreed with the plan and demonstrated an understanding of the instructions.   The patient was advised to call back or seek an in-person evaluation if the symptoms worsen or if the condition fails to improve as anticipated.  I provided 15 minutes of face-to-face time during this encounter.   Harland Dingwall, NP-C [facility] [place] [phone] (phone) [phone] (fax)  [place]

## 2022-10-16 ENCOUNTER — Encounter: Payer: Self-pay | Admitting: Gastroenterology

## 2022-10-16 NOTE — Telephone Encounter (Signed)
LVM for patient to call and schedule

## 2022-10-18 DIAGNOSIS — Z85828 Personal history of other malignant neoplasm of skin: Secondary | ICD-10-CM | POA: Diagnosis not present

## 2022-10-18 DIAGNOSIS — L821 Other seborrheic keratosis: Secondary | ICD-10-CM | POA: Diagnosis not present

## 2022-10-18 DIAGNOSIS — D485 Neoplasm of uncertain behavior of skin: Secondary | ICD-10-CM | POA: Diagnosis not present

## 2022-10-18 DIAGNOSIS — C4441 Basal cell carcinoma of skin of scalp and neck: Secondary | ICD-10-CM | POA: Diagnosis not present

## 2022-10-22 ENCOUNTER — Encounter: Payer: Self-pay | Admitting: Pulmonary Disease

## 2022-10-26 ENCOUNTER — Ambulatory Visit: Payer: Medicare HMO | Admitting: Pulmonary Disease

## 2022-11-01 ENCOUNTER — Encounter: Payer: Self-pay | Admitting: Internal Medicine

## 2022-11-01 ENCOUNTER — Ambulatory Visit (INDEPENDENT_AMBULATORY_CARE_PROVIDER_SITE_OTHER): Payer: Medicare HMO | Admitting: Internal Medicine

## 2022-11-01 VITALS — BP 118/80 | HR 64 | Temp 98.1°F | Ht 71.5 in | Wt 172.0 lb

## 2022-11-01 DIAGNOSIS — Z0000 Encounter for general adult medical examination without abnormal findings: Secondary | ICD-10-CM | POA: Diagnosis not present

## 2022-11-01 DIAGNOSIS — N400 Enlarged prostate without lower urinary tract symptoms: Secondary | ICD-10-CM

## 2022-11-01 DIAGNOSIS — Z1159 Encounter for screening for other viral diseases: Secondary | ICD-10-CM | POA: Diagnosis not present

## 2022-11-01 DIAGNOSIS — Z23 Encounter for immunization: Secondary | ICD-10-CM

## 2022-11-01 DIAGNOSIS — E785 Hyperlipidemia, unspecified: Secondary | ICD-10-CM | POA: Diagnosis not present

## 2022-11-01 LAB — LIPID PANEL
Cholesterol: 155 mg/dL (ref 0–200)
HDL: 45.7 mg/dL (ref >39.00)
LDL Cholesterol: 91 mg/dL (ref 0–99)
NonHDL: 109
Total CHOL/HDL Ratio: 3
Triglycerides: 90 mg/dL (ref 0.0–149.0)
VLDL: 18 mg/dL (ref 0.0–40.0)

## 2022-11-01 LAB — HEPATIC FUNCTION PANEL
ALT: 14 U/L (ref 0–53)
AST: 20 U/L (ref 0–37)
Albumin: 4.5 g/dL (ref 3.5–5.2)
Alkaline Phosphatase: 63 U/L (ref 39–117)
Bilirubin, Direct: 0.1 mg/dL (ref 0.0–0.3)
Total Bilirubin: 0.7 mg/dL (ref 0.2–1.2)
Total Protein: 7.6 g/dL (ref 6.0–8.3)

## 2022-11-01 LAB — PSA: PSA: 1.91 ng/mL (ref 0.10–4.00)

## 2022-11-01 NOTE — Progress Notes (Signed)
Subjective:  Patient ID: Zachary Burke, male    DOB: 1951/05/18  Age: 71 y.o. MRN: [id number]  CC: Annual Exam and Gastroesophageal Reflux   HPI Zachary Burke presents for a CPX and f/up -   He is active and denies chest pain, shortness of breath, diaphoresis, or edema.  Outpatient Medications Prior to Visit  Medication Sig Dispense Refill   Cholecalciferol 50 MCG (2000 UT) TABS Take 1 tablet (2,000 Units total) by mouth daily. 90 tablet 1   omeprazole (PRILOSEC) 40 MG capsule Take 1 capsule (40 mg total) by mouth daily. 30 capsule 0   Probiotic Product (ALIGN) 4 MG CAPS Take 1 capsule (4 mg total) by mouth daily. 30 capsule 1   psyllium (METAMUCIL) 58.6 % packet Take 1 packet by mouth daily.     sucralfate (CARAFATE) 1 GM/10ML suspension Take 10 mLs (1 g total) by mouth 4 (four) times daily -  with meals and at bedtime. 420 mL 0   acetic acid 2 % otic solution Place 4 drops into both ears 4 (four) times daily. 15 mL 0   amoxicillin-clavulanate (AUGMENTIN) 600-42.9 MG/5ML suspension Take 5 mLs (600 mg total) by mouth 3 (three) times daily. Take with meals x 10 days. 200 mL 0   fluconazole (DIFLUCAN) 150 MG tablet Take 1 tablet (150 mg total) by mouth daily. 7 tablet 0   tiZANidine (ZANAFLEX) 2 MG tablet Take 1 tablet (2 mg total) by mouth every 8 (eight) hours as needed for muscle spasms. 90 tablet 0   No facility-administered medications prior to visit.    ROS Review of Systems  Constitutional: Negative.  Negative for diaphoresis and fatigue.  HENT: Negative.  Negative for ear pain, sore throat and trouble swallowing.   Eyes: Negative.   Respiratory: Negative.  Negative for chest tightness, shortness of breath and wheezing.   Cardiovascular:  Negative for chest pain, palpitations and leg swelling.  Gastrointestinal:  Negative for abdominal pain, diarrhea, nausea and vomiting.  Genitourinary: Negative.  Negative for difficulty urinating and dysuria.  Musculoskeletal:  Negative.   Skin: Negative.   Neurological: Negative.  Negative for dizziness and weakness.  Hematological:  Negative for adenopathy. Does not bruise/bleed easily.  Psychiatric/Behavioral: Negative.      Objective:  BP 118/80 (BP Location: Left Arm, Patient Position: Sitting, Cuff Size: Large)   Pulse 64   Temp 98.1 F (36.7 C) (Oral)   Ht 5' 11.5" (1.816 m)   Wt 172 lb (78 kg)   SpO2 98%   BMI 23.65 kg/m   BP Readings from Last 3 Encounters:  11/01/22 118/80  10/05/22 128/80  07/27/22 130/80    Wt Readings from Last 3 Encounters:  11/01/22 172 lb (78 kg)  10/05/22 172 lb (78 kg)  07/27/22 171 lb (77.6 kg)    Physical Exam Vitals reviewed.  HENT:     Nose: Nose normal.     Mouth/Throat:     Mouth: Mucous membranes are moist.  Eyes:     General: No scleral icterus.    Conjunctiva/sclera: Conjunctivae normal.  Cardiovascular:     Rate and Rhythm: Normal rate and regular rhythm.     Heart sounds: No murmur heard. Pulmonary:     Effort: Pulmonary effort is normal.     Breath sounds: No stridor. No wheezing, rhonchi or rales.  Abdominal:     General: Abdomen is flat.     Palpations: There is no mass.     Tenderness: There is no abdominal tenderness.  There is no guarding.     Hernia: No hernia is present. There is no hernia in the left inguinal area or right inguinal area.  Genitourinary:    Pubic Area: No rash.      Penis: Normal and circumcised.      Testes: Normal.     Epididymis:     Right: Normal.     Left: Normal.     Prostate: Enlarged. Not tender and no nodules present.     Rectum: Guaiac result negative. External hemorrhoid present. No mass, tenderness, anal fissure or internal hemorrhoid. Normal anal tone.  Musculoskeletal:        General: Normal range of motion.     Cervical back: Neck supple.  Lymphadenopathy:     Cervical: No cervical adenopathy.     Lower Body: No right inguinal adenopathy. No left inguinal adenopathy.  Skin:    General: Skin  is warm and dry.  Neurological:     General: No focal deficit present.     Mental Status: He is alert.  Psychiatric:        Mood and Affect: Mood normal.        Behavior: Behavior normal.     Lab Results  Component Value Date   WBC 5.2 04/25/2022   HGB 15.5 04/25/2022   HCT 47.4 04/25/2022   PLT 210.0 04/25/2022   GLUCOSE 73 04/25/2022   CHOL 155 11/01/2022   TRIG 90.0 11/01/2022   HDL 45.70 11/01/2022   LDLCALC 91 11/01/2022   ALT 14 11/01/2022   AST 20 11/01/2022   NA 138 04/25/2022   K 4.5 04/25/2022   CL 102 04/25/2022   CREATININE 0.93 04/25/2022   BUN 19 04/25/2022   CO2 29 04/25/2022   TSH 2.04 04/25/2022   PSA 1.91 11/01/2022   HGBA1C 5.5 07/12/2020    US PELVIS LIMITED (TRANSABDOMINAL ONLY)  Result Date: 04/09/2022 CLINICAL DATA:  Right groin tenderness history of inguinal hernia repair EXAM: LIMITED ULTRASOUND OF PELVIS TECHNIQUE: Limited transabdominal ultrasound examination of the pelvis was performed. COMPARISON:  None Available. FINDINGS: Targeted ultrasound of the right groin performed with and without Valsalva. Additional sonography of right umbilical region performed in the region of concern. Hypoechoic area over the right groin, question mesh material related to prior hernia repair. No definite bowel containing hernia seen in the right groin. No definite sonographic correlate for right periumbilical discomfort. IMPRESSION: 1. Hypoechoic area with shadowing in the right groin in the region of concern, question mesh material related to prior hernia repair. No definite bowel containing right groin hernia, CT could be considered for further evaluation as indicated 2. No definite sonographic correlate for right periumbilical tenderness, recommend clinical management. Electronically Signed   By: Donavan Foil M.D.   On: 04/09/2022 23:34    Assessment & Plan:   [name] was seen today for annual exam and gastroesophageal reflux.  Diagnoses and all orders for this  visit:  Benign prostatic hyperplasia without lower urinary tract symptoms- His PSA is normal. -     PSA; Future -     PSA  Hyperlipidemia LDL goal <130- Statin is not indicated. -     Lipid panel; Future -     Hepatic function panel; Future -     Hepatic function panel -     Lipid panel  Need for hepatitis C screening test -     Hepatitis C antibody; Future -     Hepatitis C antibody  Routine general medical examination  at a health care facility- Exam completed, labs reviewed, vaccines reviewed and updated, cancer screenings are up-to-date, patient education was given.  Need for prophylactic vaccination and inoculation against varicella -     Zoster Vaccine Adjuvanted [facility]) injection; Inject 0.5 mLs into the muscle once for 1 dose.  Need for vaccination -     RSV,Recombinant PF (Arexvy)   I have discontinued Velva Harman tiZANidine, acetic acid, fluconazole, and amoxicillin-clavulanate. I am also having him start on Shingrix. Additionally, I am having him maintain his psyllium, Cholecalciferol, Align, sucralfate, and omeprazole.  Meds ordered this encounter  Medications   Zoster Vaccine Adjuvanted [facility]) injection    Sig: Inject 0.5 mLs into the muscle once for 1 dose.    Dispense:  0.5 mL    Refill:  1     Follow-up: Return in about 6 months (around 05/03/2023).  Scarlette Calico, MD

## 2022-11-01 NOTE — Patient Instructions (Signed)
Health Maintenance, Male Adopting a healthy lifestyle and getting preventive care are important in promoting health and wellness. Ask your health care provider about: The right schedule for you to have regular tests and exams. Things you can do on your own to prevent diseases and keep yourself healthy. What should I know about diet, weight, and exercise? Eat a healthy diet  Eat a diet that includes plenty of vegetables, fruits, low-fat dairy products, and lean protein. Do not eat a lot of foods that are high in solid fats, added sugars, or sodium. Maintain a healthy weight Body mass index (BMI) is a measurement that can be used to identify possible weight problems. It estimates body fat based on height and weight. Your health care provider can help determine your BMI and help you achieve or maintain a healthy weight. Get regular exercise Get regular exercise. This is one of the most important things you can do for your health. Most adults should: Exercise for at least 150 minutes each week. The exercise should increase your heart rate and make you sweat (moderate-intensity exercise). Do strengthening exercises at least twice a week. This is in addition to the moderate-intensity exercise. Spend less time sitting. Even light physical activity can be beneficial. Watch cholesterol and blood lipids Have your blood tested for lipids and cholesterol at 70 years of age, then have this test every 5 years. You may need to have your cholesterol levels checked more often if: Your lipid or cholesterol levels are high. You are older than 70 years of age. You are at high risk for heart disease. What should I know about cancer screening? Many types of cancers can be detected early and may often be prevented. Depending on your health history and family history, you may need to have cancer screening at various ages. This may include screening for: Colorectal cancer. Prostate cancer. Skin cancer. Lung  cancer. What should I know about heart disease, diabetes, and high blood pressure? Blood pressure and heart disease High blood pressure causes heart disease and increases the risk of stroke. This is more likely to develop in people who have high blood pressure readings or are overweight. Talk with your health care provider about your target blood pressure readings. Have your blood pressure checked: Every 3-5 years if you are 18-39 years of age. Every year if you are 40 years old or older. If you are between the ages of 65 and 75 and are a current or former smoker, ask your health care provider if you should have a one-time screening for abdominal aortic aneurysm (AAA). Diabetes Have regular diabetes screenings. This checks your fasting blood sugar level. Have the screening done: Once every three years after age 45 if you are at a normal weight and have a low risk for diabetes. More often and at a younger age if you are overweight or have a high risk for diabetes. What should I know about preventing infection? Hepatitis B If you have a higher risk for hepatitis B, you should be screened for this virus. Talk with your health care provider to find out if you are at risk for hepatitis B infection. Hepatitis C Blood testing is recommended for: Everyone born from 1945 through 1965. Anyone with known risk factors for hepatitis C. Sexually transmitted infections (STIs) You should be screened each year for STIs, including gonorrhea and chlamydia, if: You are sexually active and are younger than 70 years of age. You are older than 70 years of age and your   health care provider tells you that you are at risk for this type of infection. Your sexual activity has changed since you were last screened, and you are at increased risk for chlamydia or gonorrhea. Ask your health care provider if you are at risk. Ask your health care provider about whether you are at high risk for HIV. Your health care provider  may recommend a prescription medicine to help prevent HIV infection. If you choose to take medicine to prevent HIV, you should first get tested for HIV. You should then be tested every 3 months for as long as you are taking the medicine. Follow these instructions at home: Alcohol use Do not drink alcohol if your health care provider tells you not to drink. If you drink alcohol: Limit how much you have to 0-2 drinks a day. Know how much alcohol is in your drink. In the [place] one drink equals one 12 oz bottle of beer (355 mL), one 5 oz glass of wine (148 mL), or one 1 oz glass of hard liquor (44 mL). Lifestyle Do not use any products that contain nicotine or tobacco. These products include cigarettes, chewing tobacco, and vaping devices, such as e-cigarettes. If you need help quitting, ask your health care provider. Do not use street drugs. Do not share needles. Ask your health care provider for help if you need support or information about quitting drugs. General instructions Schedule regular health, dental, and eye exams. Stay current with your vaccines. Tell your health care provider if: You often feel depressed. You have ever been abused or do not feel safe at home. Summary Adopting a healthy lifestyle and getting preventive care are important in promoting health and wellness. Follow your health care provider's instructions about healthy diet, exercising, and getting tested or screened for diseases. Follow your health care provider's instructions on monitoring your cholesterol and blood pressure. This information is not intended to replace advice given to you by your health care provider. Make sure you discuss any questions you have with your health care provider. Document Revised: 03/27/2021 Document Reviewed: 03/27/2021 Elsevier Patient Education  2023 [employer]  

## 2022-11-02 DIAGNOSIS — Z23 Encounter for immunization: Secondary | ICD-10-CM | POA: Insufficient documentation

## 2022-11-02 LAB — HEPATITIS C ANTIBODY: Hepatitis C Ab: NONREACTIVE

## 2022-11-02 MED ORDER — SHINGRIX 50 MCG/0.5ML IM SUSR: 0.5000 mL | Freq: Once | INTRAMUSCULAR | 1 refills | Status: AC

## 2022-11-06 DIAGNOSIS — L570 Actinic keratosis: Secondary | ICD-10-CM | POA: Diagnosis not present

## 2022-11-19 ENCOUNTER — Telehealth: Payer: Medicare HMO | Admitting: Physician Assistant

## 2022-11-19 DIAGNOSIS — U071 COVID-19: Secondary | ICD-10-CM | POA: Diagnosis not present

## 2022-11-19 MED ORDER — NIRMATRELVIR/RITONAVIR (PAXLOVID)TABLET: 3.0000 | ORAL_TABLET | Freq: Two times a day (BID) | ORAL | 0 refills | Status: AC

## 2022-11-19 NOTE — Patient Instructions (Signed)
Zachary Burke, thank you for joining Mar Daring, PA-C for today's virtual visit.  While this provider is not your primary care provider (PCP), if your PCP is located in our provider database this encounter information will be shared with them immediately following your visit.   [facility] account gives you access to today's visit and all your visits, tests, and labs performed at [facility] " click here if you don't have a [facility] account or go to [email or web address]  Consent: (Patient) Zachary Burke provided verbal consent for this virtual visit at the beginning of the encounter.  Current Medications:  Current Outpatient Medications:    nirmatrelvir/ritonavir (PAXLOVID) 20 x 150 MG & 10 x '100MG'$  TABS, Take 3 tablets by mouth 2 (two) times daily for 5 days. (Take nirmatrelvir 150 mg two tablets twice daily for 5 days and ritonavir 100 mg one tablet twice daily for 5 days) Patient GFR is 83, Disp: 30 tablet, Rfl: 0   Cholecalciferol 50 MCG (2000 UT) TABS, Take 1 tablet (2,000 Units total) by mouth daily., Disp: 90 tablet, Rfl: 1   omeprazole (PRILOSEC) 40 MG capsule, Take 1 capsule (40 mg total) by mouth daily., Disp: 30 capsule, Rfl: 0   Probiotic Product (ALIGN) 4 MG CAPS, Take 1 capsule (4 mg total) by mouth daily., Disp: 30 capsule, Rfl: 1   psyllium (METAMUCIL) 58.6 % packet, Take 1 packet by mouth daily., Disp: , Rfl:    sucralfate (CARAFATE) 1 GM/10ML suspension, Take 10 mLs (1 g total) by mouth 4 (four) times daily -  with meals and at bedtime., Disp: 420 mL, Rfl: 0   Medications ordered in this encounter:  Meds ordered this encounter  Medications   nirmatrelvir/ritonavir (PAXLOVID) 20 x 150 MG & 10 x '100MG'$  TABS    Sig: Take 3 tablets by mouth 2 (two) times daily for 5 days. (Take nirmatrelvir 150 mg two tablets twice daily for 5 days and ritonavir 100 mg one tablet twice daily for 5 days) Patient GFR is 83    Dispense:  30 tablet     Refill:  0    Order Specific Question:   Supervising Provider    Answer:   Chase Picket [id number]     *If you need refills on other medications prior to your next appointment, please contact your pharmacy*  Follow-Up: Call back or seek an in-person evaluation if the symptoms worsen or if the condition fails to improve as anticipated.  [facility] [phone]  Other Instructions  Can take to lessen severity: Vit C '500mg'$  twice daily Quercertin 250-'500mg'$  twice daily Zinc 75-'100mg'$  daily Melatonin 3-6 mg at bedtime Vit D3 1000-2000 IU daily Aspirin 81 mg daily with food Optional: Famotidine '20mg'$  daily Also can add tylenol/ibuprofen as needed for fevers and body aches May add Mucinex or Mucinex DM as needed for cough/congestion   COVID-19 COVID-19, or coronavirus disease 2019, is an infection that is caused by a new (novel) coronavirus called SARS-CoV-2. COVID-19 can cause many symptoms. In some people, the virus may not cause any symptoms. In others, it may cause mild or severe symptoms. Some people with severe infection develop severe disease. What are the causes? This illness is caused by a virus. The virus may be in the air as tiny specks of fluid (aerosols) or droplets, or it may be on surfaces. You may catch the virus by: Breathing in droplets from an infected person. Droplets can be spread by a person  breathing, speaking, singing, coughing, or sneezing. Touching something, like a table or a doorknob, that has virus on it (is contaminated) and then touching your mouth, nose, or eyes. What increases the risk? Risk for infection: You are more likely to get infected with the COVID-19 virus if: You are within 6 ft (1.8 m) of a person with COVID-19 for 15 minutes or longer. You are providing care for a person who is infected with COVID-19. You are in close personal contact with other people. Close personal contact includes hugging, kissing, or sharing eating or  drinking utensils. Risk for serious illness caused by COVID-19: You are more likely to get seriously ill from the COVID-19 virus if: You have cancer. You have a long-term (chronic) disease, such as: Chronic lung disease. This includes pulmonary embolism, chronic obstructive pulmonary disease, and cystic fibrosis. Long-term disease that lowers your body's ability to fight infection (immunocompromise). Serious cardiac conditions, such as heart failure, coronary artery disease, or cardiomyopathy. Diabetes. Chronic kidney disease. Liver diseases. These include cirrhosis, nonalcoholic fatty liver disease, alcoholic liver disease, or autoimmune hepatitis. You have obesity. You are pregnant or were recently pregnant. You have sickle cell disease. What are the signs or symptoms? Symptoms of this condition can range from mild to severe. Symptoms may appear any time from 2 to 14 days after being exposed to the virus. They include: Fever or chills. Shortness of breath or trouble breathing. Feeling tired or very tired. Headaches, body aches, or muscle aches. Runny or stuffy nose, sneezing, coughing, or sore throat. New loss of taste or smell. This is rare. Some people may also have stomach problems, such as nausea, vomiting, or diarrhea. Other people may not have any symptoms of COVID-19. How is this diagnosed? This condition may be diagnosed by testing samples to check for the COVID-19 virus. The most common tests are the PCR test and the antigen test. Tests may be done in the lab or at home. They include: Using a swab to take a sample of fluid from the back of your nose and throat (nasopharyngeal fluid), from your nose, or from your throat. Testing a sample of saliva from your mouth. Testing a sample of coughed-up mucus from your lungs (sputum). How is this treated? Treatment for COVID-19 infection depends on the severity of the condition. Mild symptoms can be managed at home with rest, fluids,  and over-the-counter medicines. Serious symptoms may be treated in a hospital intensive care unit (ICU). Treatment in the ICU may include: Supplemental oxygen. Extra oxygen is given through a tube in the nose, a face mask, or a hood. Medicines. These may include: Antivirals, such as monoclonal antibodies. These help your body fight off certain viruses that can cause disease. Anti-inflammatories, such as corticosteroids. These reduce inflammation and suppress the immune system. Antithrombotics. These prevent or treat blood clots, if they develop. Convalescent plasma. This helps boost your immune system, if you have an underlying immunosuppressive condition or are getting immunosuppressive treatments. Prone positioning. This means you will lie on your stomach. This helps oxygen to get into your lungs. Infection control measures. If you are at risk for more serious illness caused by COVID-19, your health care provider may prescribe two long-acting monoclonal antibodies, given together every 6 months. How is this prevented? To protect yourself: Use preventive medicine (pre-exposure prophylaxis). You may get pre-exposure prophylaxis if you have moderate or severe immunocompromise. Get vaccinated. Anyone 28 months old or older who meets guidelines can get a COVID-19 vaccine or vaccine series.  This includes people who are pregnant or making breast milk (lactating). Get an added dose of COVID-19 vaccine after your first vaccine or vaccine series if you have moderate to severe immunocompromise. This applies if you have had a solid organ transplant or have been diagnosed with an immunocompromising condition. You should get the added dose 4 weeks after you got the first COVID-19 vaccine or vaccine series. If you get an mRNA vaccine, you will need a 3-dose primary series. If you get the J&J/Janssen vaccine, you will need a 2-dose primary series, with the second dose being an mRNA vaccine. Talk to your health  care provider about getting experimental monoclonal antibodies. This treatment is approved under emergency use authorization to prevent severe illness before or after being exposed to the COVID-19 virus. You may be given monoclonal antibodies if: You have moderate or severe immunocompromise. This includes treatments that lower your immune response. People with immunocompromise may not develop protection against COVID-19 when they are vaccinated. You cannot be vaccinated. You may not get a vaccine if you have a severe allergic reaction to the vaccine or its components. You are not fully vaccinated. You are in a facility where COVID-19 is present and: Are in close contact with a person who is infected with the COVID-19 virus. Are at high risk of being exposed to the COVID-19 virus. You are at risk of illness from new variants of the COVID-19 virus. To protect others: If you have symptoms of COVID-19, take steps to prevent the virus from spreading to others. Stay home. Leave your house only to get medical care. Do not use public transit, if possible. Do not travel while you are sick. Wash your hands often with soap and water for at least 20 seconds. If soap and water are not available, use alcohol-based hand sanitizer. Make sure that all people in your household wash their hands well and often. Cough or sneeze into a tissue or your sleeve or elbow. Do not cough or sneeze into your hand or into the air. Where to find more information Centers for Disease Control and Prevention: CharmC[employer].be World Health Organization: https://www.castaneda.info/ Get help right away if: You have trouble breathing. You have pain or pressure in your chest. You are confused. You have bluish lips and fingernails. You have trouble waking from sleep. You have symptoms that get worse. These symptoms may be an emergency. Get help right away. Call 911. Do not wait to see if the symptoms will go  away. Do not drive yourself to the hospital. Summary COVID-19 is an infection that is caused by a new coronavirus. Sometimes, there are no symptoms. Other times, symptoms range from mild to severe. Some people with a severe COVID-19 infection develop severe disease. The virus that causes COVID-19 can spread from person to person through droplets or aerosols from breathing, speaking, singing, coughing, or sneezing. Mild symptoms of COVID-19 can be managed at home with rest, fluids, and over-the-counter medicines. This information is not intended to replace advice given to you by your health care provider. Make sure you discuss any questions you have with your health care provider. Document Revised: 10/24/2021 Document Reviewed: 10/26/2021 Elsevier Patient Education  [employer].    If you have been instructed to have an in-person evaluation today at a local Urgent Care facility, please use the link below. It will take you to a list of all of our available [facility], including address, phone number and hours of operation. Please do not  delay care.  [facility]  If you or a family member do not have a primary care provider, use the link below to schedule a visit and establish care. When you choose a [facility] primary care physician or advanced practice provider, you gain a long-term partner in health. Find a Primary Care Provider  Learn more about [facility] in-office and virtual care options: [facility]

## 2022-11-19 NOTE — Progress Notes (Signed)
Virtual Visit Consent   Zachary Burke, you are scheduled for a virtual visit with a [facility] provider today. Just as with appointments in the office, your consent must be obtained to participate. Your consent will be active for this visit and any virtual visit you may have with one of our providers in the next 365 days. If you have a MyChart account, a copy of this consent can be sent to you electronically.  As this is a virtual visit, video technology does not allow for your provider to perform a traditional examination. This may limit your provider's ability to fully assess your condition. If your provider identifies any concerns that need to be evaluated in person or the need to arrange testing (such as labs, EKG, etc.), we will make arrangements to do so. Although advances in technology are sophisticated, we cannot ensure that it will always work on either your end or our end. If the connection with a video visit is poor, the visit may have to be switched to a telephone visit. With either a video or telephone visit, we are not always able to ensure that we have a secure connection.  By engaging in this virtual visit, you consent to the provision of healthcare and authorize for your insurance to be billed (if applicable) for the services provided during this visit. Depending on your insurance coverage, you may receive a charge related to this service.  I need to obtain your verbal consent now. Are you willing to proceed with your visit today? Zachary Burke has provided verbal consent on 11/19/2022 for a virtual visit (video or telephone). Zachary Daring, PA-C  Date: 11/19/2022 11:27 AM  Virtual Visit via Video Note   I, Zachary Burke, connected with  Zachary Burke  ([id number], 08-Nov-1951) on 11/19/22 at 11:15 AM EST by a video-enabled telemedicine application and verified that I am speaking with the correct person using two identifiers.  Location: Patient: Virtual Visit Location  Patient: Home Provider: Virtual Visit Location Provider: Home Office   I discussed the limitations of evaluation and management by telemedicine and the availability of in person appointments. The patient expressed understanding and agreed to proceed.    History of Present Illness: Zachary Burke is a 72 y.o. who identifies as a male who was assigned male at birth, and is being seen today for Covid 73.  HPI: URI  This is a new problem. Episode onset: Tested positive for Covid 19 last night; symptoms started last night overnight. The problem has been gradually worsening. Associated symptoms include congestion, headaches, sinus pain and a sore throat. Pertinent negatives include no coughing, ear pain, nausea, plugged ear sensation or vomiting. Associated symptoms comments: Chills, myalgias. He has tried acetaminophen for the symptoms. The treatment provided no relief.    BP 11/78 O2 96%  Problems:  Patient Active Problem List   Diagnosis Date Noted   Need for prophylactic vaccination and inoculation against varicella 11/02/2022   Need for vaccination 11/02/2022   Need for hepatitis C screening test 11/01/2022   Chronic cough 07/04/2022   Peripheral sensory neuropathy 04/25/2022   Bradycardia 04/25/2022   Seasonal allergic rhinitis due to pollen 02/28/2022   Nocturia more than twice per night 11/30/2021   Mild obstructive sleep apnea-hypopnea syndrome 11/30/2021   Chronic sinusitis 09/07/2021   Chronic bilateral low back pain without sciatica 01/31/2021   Vitamin D deficiency disease 07/13/2020   Erectile dysfunction due to arterial insufficiency 07/12/2020   Routine general medical examination at a  health care facility 07/12/2020   Benign prostatic hyperplasia without lower urinary tract symptoms 07/12/2020   Hyperlipidemia LDL goal <130 07/12/2020   PUD (peptic ulcer disease) 07/12/2020   Elevated systolic blood pressure reading without diagnosis of hypertension 07/12/2020    Esophageal reflux 08/21/2010   Personal history of colonic polyps 08/21/2010    Allergies: No Known Allergies Medications:  Current Outpatient Medications:    nirmatrelvir/ritonavir (PAXLOVID) 20 x 150 MG & 10 x '100MG'$  TABS, Take 3 tablets by mouth 2 (two) times daily for 5 days. (Take nirmatrelvir 150 mg two tablets twice daily for 5 days and ritonavir 100 mg one tablet twice daily for 5 days) Patient GFR is 83, Disp: 30 tablet, Rfl: 0   Cholecalciferol 50 MCG (2000 UT) TABS, Take 1 tablet (2,000 Units total) by mouth daily., Disp: 90 tablet, Rfl: 1   omeprazole (PRILOSEC) 40 MG capsule, Take 1 capsule (40 mg total) by mouth daily., Disp: 30 capsule, Rfl: 0   Probiotic Product (ALIGN) 4 MG CAPS, Take 1 capsule (4 mg total) by mouth daily., Disp: 30 capsule, Rfl: 1   psyllium (METAMUCIL) 58.6 % packet, Take 1 packet by mouth daily., Disp: , Rfl:    sucralfate (CARAFATE) 1 GM/10ML suspension, Take 10 mLs (1 g total) by mouth 4 (four) times daily -  with meals and at bedtime., Disp: 420 mL, Rfl: 0  Observations/Objective: Patient is well-developed, well-nourished in no acute distress.  Resting comfortably at home.  Head is normocephalic, atraumatic.  No labored breathing.  Speech is clear and coherent with logical content.  Patient is alert and oriented at baseline.    Assessment and Plan: 1. COVID-19 - nirmatrelvir/ritonavir (PAXLOVID) 20 x 150 MG & 10 x '100MG'$  TABS; Take 3 tablets by mouth 2 (two) times daily for 5 days. (Take nirmatrelvir 150 mg two tablets twice daily for 5 days and ritonavir 100 mg one tablet twice daily for 5 days) Patient GFR is 83  Dispense: 30 tablet; Refill: 0 - MyChart COVID-19 home monitoring program; Future  - Continue OTC symptomatic management of choice - Will send OTC vitamins and supplement information through AVS - Paxlovid prescribed - Patient enrolled in MyChart symptom monitoring - Push fluids - Rest as needed - Discussed return precautions and when  to seek in-person evaluation, sent via AVS as well   Follow Up Instructions: I discussed the assessment and treatment plan with the patient. The patient was provided an opportunity to ask questions and all were answered. The patient agreed with the plan and demonstrated an understanding of the instructions.  A copy of instructions were sent to the patient via MyChart unless otherwise noted below.    The patient was advised to call back or seek an in-person evaluation if the symptoms worsen or if the condition fails to improve as anticipated.  Time:  I spent 10 minutes with the patient via telehealth technology discussing the above problems/concerns.    Zachary Daring, PA-C

## 2022-11-20 ENCOUNTER — Encounter: Payer: Self-pay | Admitting: Gastroenterology

## 2022-11-26 NOTE — Progress Notes (Addendum)
[facility] Consult Note:  History: Zachary Burke 11/27/2022  Referring provider: Janith Lima, MD  Reason for consult/chief complaint: Colonoscopy (Discuss colonoscopy and EGD)   Subjective  HPI: Zachary Burke was referred by primary care for history of colon polyps and concern related to family history of esophageal cancer. Adenomatous polyps 2006, no polyps in 2011.  Was due for surveillance colonoscopy with our practice in 2021.  Zachary Burke was here today to discuss digestive issues and determine when he next needs any endoscopic procedures.  He reports very infrequent reflux symptoms of heartburn or regurgitation.  He will take an as needed Tums perhaps every few weeks and rarely needs a proton pump inhibitor.  He thinks he may have last taken a course of that a few years ago.  He denies dysphagia, odynophagia, nausea vomiting loss of appetite or weight loss. He also went to the [facility] within the last several years and had an EGD and colonoscopy.  He recalls it being some colon polyps and thought perhaps they told him to come back in 3 to 5 years.  He is pretty sure there was no Barrett's esophagus in the upper endoscopy otherwise looked fairly good. His bowel habits are regular without rectal bleeding.  He had some concerns because a grandfather had esophageal cancer.  His father is not known to have Barrett's esophagus nor esophageal cancer.   ROS:  Review of Systems He denies chest pain dyspnea or dysuria  Excellent health overall  Past Medical History: Past Medical History:  Diagnosis Date   Basal cell carcinoma    Colon polyps    2002,2006   Eczema    GERD (gastroesophageal reflux disease)    Hiatal hernia      Past Surgical History: Past Surgical History:  Procedure Laterality Date   HERNIA REPAIR  2019   NASAL SEPTUM SURGERY       Family History: Family History  Problem Relation Age of Onset   COPD Mother    Esophageal cancer  Paternal Grandfather        passed away at 73   Colon cancer Paternal Aunt    Prostate cancer Paternal Uncle     Social History: Social History   Socioeconomic History   Marital status: Married    Spouse name: Benjamine Mola   Number of children: 3   Years of education: Not on file   Highest education level: Not on file  Occupational History   Occupation: Scientist, clinical (histocompatibility and immunogenetics): [employer]  Tobacco Use   Smoking status: Never    Passive exposure: Past   Smokeless tobacco: Never  Vaping Use   Vaping Use: Never used  Substance and Sexual Activity   Alcohol use: Yes   Drug use: No   Sexual activity: Not on file  Other Topics Concern   Not on file  Social History Narrative   Lives with wife   Right handed   Caffeine: half a cup of coffee every morning   Social Determinants of Health   Financial Resource Strain: Low Risk  (08/29/2021)   Overall Financial Resource Strain (CARDIA)    Difficulty of Paying Living Expenses: Not hard at all  Food Insecurity: No Food Insecurity (08/29/2021)   Hunger Vital Sign    Worried About Running Out of Food in the Last Year: Never true    Ran Out of Food in the Last Year: Never true  Transportation Needs: No Transportation Needs (08/29/2021)   PRAPARE -  Hydrologist (Medical): No    Lack of Transportation (Non-Medical): No  Physical Activity: Sufficiently Active (08/29/2021)   Exercise Vital Sign    Days of Exercise per Week: 7 days    Minutes of Exercise per Session: 60 min  Stress: No Stress Concern Present (08/29/2021)   [place]    Feeling of Stress : Not at all  Social Connections: [place] (08/29/2021)   Social Connection and Isolation Panel [NHANES]    Frequency of Communication with Friends and Family: More than three times a week    Frequency of Social Gatherings with Friends and Family: More than three times a week     Attends Religious Services: More than 4 times per year    Active Member of [employer] or Organizations: Yes    Attends Music therapist: More than 4 times per year    Marital Status: Married    Allergies: No Known Allergies  Outpatient Meds: Current Outpatient Medications  Medication Sig Dispense Refill   psyllium (METAMUCIL) 58.6 % packet Take 1 packet by mouth daily.     Cholecalciferol 50 MCG (2000 UT) TABS Take 1 tablet (2,000 Units total) by mouth daily. (Patient not taking: Reported on 11/27/2022) 90 tablet 1   omeprazole (PRILOSEC) 40 MG capsule Take 1 capsule (40 mg total) by mouth daily. (Patient not taking: Reported on 11/27/2022) 30 capsule 0   Probiotic Product (ALIGN) 4 MG CAPS Take 1 capsule (4 mg total) by mouth daily. (Patient not taking: Reported on 11/27/2022) 30 capsule 1   sucralfate (CARAFATE) 1 GM/10ML suspension Take 10 mLs (1 g total) by mouth 4 (four) times daily -  with meals and at bedtime. (Patient not taking: Reported on 11/27/2022) 420 mL 0   No current facility-administered medications for this visit.      ___________________________________________________________________ Objective   Exam:  BP 124/68   Pulse 63   Ht 5' 11.5" (1.816 m)   Wt 177 lb (80.3 kg)   BMI 24.34 kg/m  Wt Readings from Last 3 Encounters:  11/27/22 177 lb (80.3 kg)  11/01/22 172 lb (78 kg)  10/05/22 172 lb (78 kg)    General: Well-appearing, very pleasant Eyes: sclera anicteric, no redness ENT: oral mucosa moist without lesions, no cervical or supraclavicular lymphadenopathy CV: Regular without appreciable murmur, no JVD, no peripheral edema Resp: clear to auscultation bilaterally, normal RR and effort noted GI: soft, no tenderness, with active bowel sounds. No guarding or palpable organomegaly noted. Skin; warm and dry, no rash or jaundice noted Neuro: awake, alert and oriented x 3. Normal gross motor function and fluent speech  Labs:  No recent data for  review.  Prior endoscopic reports from our practice as noted above.  Assessment: Encounter Diagnoses  Name Primary?   Heartburn Yes   Personal history of colonic polyps    Family history of esophageal cancer     Infrequent reflux symptoms with no red flag signs.  Reported history of colon polyps, need records from [facility] to determine appropriate interval for surveillance colonoscopy.  Paternal grandfather had esophageal cancer, though this is not known to put Zachary Burke at increased risk of esophageal cancer, particularly if he did not have Barrett's on the last upper endoscopy.  Plan:  [facility] records requested.  When received, will review them and set appropriate recalls while sending those recommendations to Zachary Burke by portal.  Thank you for the courtesy of  this consult.  Please call me with any questions or concerns.  Nelida Meuse III  CC: Referring provider noted above  Record review addendum 11/30/2022  [facility] endoscopic reports:  Colonoscopy 12/15/2018 by Dr. Alessandra Bevels  Complete exam to terminal ileum, good preparation.  5 diminutive tubular adenomas 2 diminutive rectal hyperplastic polyps Internal hemorrhoids  EGD same day: Gastric mucosal erythema, few fundic gland polyps confirmed on biopsy, H. pylori negative, otherwise normal exam. Normal esophageal mucosa biopsies negative for intestinal metaplasia or EOE.  Patient due for surveillance colonoscopy, arrangements will be made.  Madelon Lips, MD

## 2022-11-27 ENCOUNTER — Encounter: Payer: Self-pay | Admitting: Gastroenterology

## 2022-11-27 ENCOUNTER — Ambulatory Visit: Payer: Medicare HMO | Admitting: Gastroenterology

## 2022-11-27 VITALS — BP 124/68 | HR 63 | Ht 71.5 in | Wt 177.0 lb

## 2022-11-27 DIAGNOSIS — Z800 Family history of malignant neoplasm of digestive organs: Secondary | ICD-10-CM

## 2022-11-27 DIAGNOSIS — R12 Heartburn: Secondary | ICD-10-CM

## 2022-11-27 DIAGNOSIS — Z86010 Personal history of colonic polyps: Secondary | ICD-10-CM

## 2022-11-27 NOTE — Patient Instructions (Signed)
_______________________________________________________  If you are age 70 or older, your body mass index should be between 23-30. Your Body mass index is 24.34 kg/m. If this is out of the aforementioned range listed, please consider follow up with your Primary Care Provider.  If you are age 34 or younger, your body mass index should be between 19-25. Your Body mass index is 24.34 kg/m. If this is out of the aformentioned range listed, please consider follow up with your Primary Care Provider.   ________________________________________________________  The [facility] providers would like to encourage you to use [facility] to communicate with providers for non-urgent requests or questions.  Due to long hold times on the telephone, sending your provider a message by [facility] may be a faster and more efficient way to get a response.  Please allow 48 business hours for a response.  Please remember that this is for non-urgent requests.  _______________________________________________________  It was a pleasure to see you today!  Thank you for trusting me with your gastrointestinal care!

## 2022-11-28 ENCOUNTER — Encounter: Payer: Self-pay | Admitting: Internal Medicine

## 2022-11-30 ENCOUNTER — Other Ambulatory Visit: Payer: Self-pay

## 2022-11-30 DIAGNOSIS — Z86010 Personal history of colonic polyps: Secondary | ICD-10-CM

## 2022-11-30 MED ORDER — PLENVU 140 G PO SOLR: 140.0000 g | ORAL | 0 refills | Status: DC

## 2022-12-05 ENCOUNTER — Encounter: Payer: Self-pay | Admitting: Pulmonary Disease

## 2022-12-05 ENCOUNTER — Ambulatory Visit (INDEPENDENT_AMBULATORY_CARE_PROVIDER_SITE_OTHER): Payer: Medicare HMO | Admitting: Pulmonary Disease

## 2022-12-05 VITALS — Ht 71.5 in | Wt 178.0 lb

## 2022-12-05 DIAGNOSIS — R0600 Dyspnea, unspecified: Secondary | ICD-10-CM

## 2022-12-05 NOTE — Progress Notes (Signed)
Synopsis: Referred in September 2023 for chronic cough  Subjective:   PATIENT ID: Zachary Burke GENDER: male DOB: 05-Sep-1951, MRN: [id number]  HPI  No chief complaint on file.  Zachary Burke is a 72 year old man, never smoker with hiatal hernia and GERD who is referred to pulmonary clinic for cough and shortness of breath.   Pulmonary function tests 07/30/22 are within normal limits and he had a normal cardiopulmonary exercise test 08/28/22.   Initial OV 07/27/22 Patient reports issues with heat intolerance over recent years. 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 He notes having trouble being active in temperatures over 70 degrees where he experiences significant fatigue, shortness of breath, weakness, heart racing and often feels like he is in a dream like state. During these episodes he reports a blood pressure in the 140s/90s, HR 70s and O2 saturation 93%. Rest with deep breaths and eating nuts/bananas along with hydrating helps him recover.   He had lyme disease in 1996 and 2003 treated with doxycyline. He had ENT surgery with Dr. Redmond Baseman with removal of fungal balls from sinuses and correction of deviated septum in 2006.   He is a never smoker. He grew up with significant second hand smoke. 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 Denies significant camp fire smoke exposure. He does wood working as a hobby and uses a English as a second language teacher. No pets at home.   Past Medical History:  Diagnosis Date   Basal cell carcinoma    Colon polyps    2002,2006   Eczema    GERD (gastroesophageal reflux disease)    Hiatal hernia      Family History  Problem Relation Age of Onset   COPD Mother    Esophageal cancer Paternal Grandfather        passed away at 25   Colon cancer Paternal Aunt    Prostate cancer Paternal Uncle      Social History   Socioeconomic History   Marital status: Married    Spouse  name: Benjamine Mola   Number of children: 3   Years of education: Not on file   Highest education level: Not on file  Occupational History   Occupation: Scientist, clinical (histocompatibility and immunogenetics): [employer]  Tobacco Use   Smoking status: Never    Passive exposure: Past   Smokeless tobacco: Never  Vaping Use   Vaping Use: Never used  Substance and Sexual Activity   Alcohol use: Yes   Drug use: No   Sexual activity: Not on file  Other Topics Concern   Not on file  Social History Narrative   Lives with wife   Right handed   Caffeine: half a cup of coffee every morning   Social Determinants of Health   Financial Resource Strain: Low Risk  (08/29/2021)   Overall Financial Resource Strain (CARDIA)    Difficulty of Paying Living Expenses: Not hard at all  Food Insecurity: No Food Insecurity (08/29/2021)   Hunger Vital Sign    Worried About Running Out of Food in the Last Year: Never true    [place] in the Last Year: Never true  Transportation Needs: No Transportation Needs (08/29/2021)   PRAPARE - Hydrologist (Medical): No    Lack of Transportation (Non-Medical): No  Physical Activity: Sufficiently Active (08/29/2021)   Exercise Vital Sign    Days of Exercise  per Week: 7 days    Minutes of Exercise per Session: 60 min  Stress: No Stress Concern Present (08/29/2021)   [place]    Feeling of Stress : Not at all  Social Connections: [place] (08/29/2021)   Social Connection and Isolation Panel [NHANES]    Frequency of Communication with Friends and Family: More than three times a week    Frequency of Social Gatherings with Friends and Family: More than three times a week    Attends Religious Services: More than 4 times per year    Active Member of [employer] or Organizations: Yes    Attends Music therapist: More than 4 times per year    Marital Status: Married  Arboriculturist Violence: Not At Risk (08/29/2021)   Humiliation, Afraid, Rape, and Kick questionnaire    Fear of Current or Ex-Partner: No    Emotionally Abused: No    Physically Abused: No    Sexually Abused: No     No Known Allergies   Outpatient Medications Prior to Visit  Medication Sig Dispense Refill   Cholecalciferol 50 MCG (2000 UT) TABS Take 1 tablet (2,000 Units total) by mouth daily. (Patient not taking: Reported on 11/27/2022) 90 tablet 1   omeprazole (PRILOSEC) 40 MG capsule Take 1 capsule (40 mg total) by mouth daily. (Patient not taking: Reported on 11/27/2022) 30 capsule 0   PEG-KCl-NaCl-NaSulf-Na Asc-C (PLENVU) 140 g SOLR Take 140 g by mouth as directed. Manufacturer's coupon Universal coupon code:BIN: C1589615; GROUP: [id number]; PCN: [id number]; IDCY:[id number]; PAY NO MORE $50 1 each 0   Probiotic Product (ALIGN) 4 MG CAPS Take 1 capsule (4 mg total) by mouth daily. (Patient not taking: Reported on 11/27/2022) 30 capsule 1   psyllium (METAMUCIL) 58.6 % packet Take 1 packet by mouth daily.     sucralfate (CARAFATE) 1 GM/10ML suspension Take 10 mLs (1 g total) by mouth 4 (four) times daily -  with meals and at bedtime. (Patient not taking: Reported on 11/27/2022) 420 mL 0   No facility-administered medications prior to visit.    Review of Systems  Constitutional:  Positive for malaise/fatigue. Negative for chills, fever and weight loss.  HENT:  Negative for congestion, sinus pain and sore throat.   Eyes: Negative.   Respiratory:  Positive for cough and shortness of breath. Negative for hemoptysis, sputum production and wheezing.   Cardiovascular:  Negative for chest pain, palpitations, orthopnea, claudication and leg swelling.  Gastrointestinal:  Negative for abdominal pain, heartburn, nausea and vomiting.  Genitourinary: Negative.   Musculoskeletal:  Negative for joint pain and myalgias.  Skin:  Negative for rash.  Neurological:  Positive for weakness.  Endo/Heme/Allergies: Negative.    Psychiatric/Behavioral: Negative.     Objective:   There were no vitals filed for this visit.    Physical Exam Constitutional:      General: He is not in acute distress. HENT:     Head: Normocephalic and atraumatic.  Eyes:     Extraocular Movements: Extraocular movements intact.     Conjunctiva/sclera: Conjunctivae normal.     Pupils: Pupils are equal, round, and reactive to light.  Cardiovascular:     Rate and Rhythm: Normal rate and regular rhythm.     Pulses: Normal pulses.     Heart sounds: Normal heart sounds. No murmur heard. Pulmonary:     Effort: Pulmonary effort is normal. Prolonged expiration present.     Breath sounds: Normal breath  sounds.  Abdominal:     General: Bowel sounds are normal.     Palpations: Abdomen is soft.  Musculoskeletal:     Right lower leg: No edema.     Left lower leg: No edema.  Lymphadenopathy:     Cervical: No cervical adenopathy.  Skin:    General: Skin is warm and dry.  Neurological:     General: No focal deficit present.     Mental Status: He is alert.  Psychiatric:        Mood and Affect: Mood normal.        Behavior: Behavior normal.        Thought Content: Thought content normal.        Judgment: Judgment normal.    CBC    Component Value Date/Time   WBC 5.2 04/25/2022 1011   RBC 5.32 04/25/2022 1011   HGB 15.5 04/25/2022 1011   HCT 47.4 04/25/2022 1011   PLT 210.0 04/25/2022 1011   MCV 89.1 04/25/2022 1011   MCH 29.4 07/12/2020 1107   MCHC 32.6 04/25/2022 1011   RDW 14.6 04/25/2022 1011   LYMPHSABS 1.4 04/25/2022 1011   MONOABS 0.6 04/25/2022 1011   EOSABS 0.2 04/25/2022 1011   BASOSABS 0.0 04/25/2022 1011      Latest Ref Rng & Units 04/25/2022   10:11 AM 07/04/2021   11:57 AM 07/12/2020   11:07 AM  BMP  Glucose 70 - 99 mg/dL 73  86  97   BUN 6 - 23 mg/dL '19  16  16   '$ Creatinine 0.40 - 1.50 mg/dL 0.93  1.02  1.08   BUN/Creat Ratio 6 - 22 (calc)   NOT APPLICABLE   Sodium A999333 - 145 mEq/L 138  137  138    Potassium 3.5 - 5.1 mEq/L 4.5  4.8  4.7   Chloride 96 - 112 mEq/L 102  100  102   CO2 19 - 32 mEq/L '29  31  28   '$ Calcium 8.4 - 10.5 mg/dL 9.8  9.9  9.5    Chest imaging: CXR 08/30/18 Lung volumes are normal. No consolidative airspace disease. No pleural effusions. No pneumothorax. No pulmonary nodule or mass noted. Pulmonary vasculature and the cardiomediastinal silhouette are within normal limits.  PFT:    Latest Ref Rng & Units 07/30/2022    4:14 PM  PFT Results  FVC-Pre L 5.88   FVC-Predicted Pre % 121   FVC-Post L 5.80   FVC-Predicted Post % 119   Pre FEV1/FVC % % 74   Post FEV1/FCV % % 77   FEV1-Pre L 4.34   FEV1-Predicted Pre % 121   FEV1-Post L 4.44     Labs:  Path:  Echo:  Heart Catheterization:  Exercise stress test 05/24/22   The ECG was negative for ischemia   Exercise capacity was excellent. Patient exercised for 9 min and 0 sec. Maximum HR of 134 bpm. MPHR 89.0 %. Peak METS 10.1 .   Normal blood pressure response noted during stress. Heart rate recovery was normal.   No ST deviation was noted.  Assessment & Plan:   No diagnosis found.  Discussion: Regina Blancett is a 72 year old man, never smoker with hiatal hernia and GERD who is referred to pulmonary clinic for cough and shortness of breath.  Patient has rising serum bicarb rates over recent years concerning for CO2 retention along with prolonged expiratory phase on exam concerning for underlying obstructive lung disease. Risk factors include significant second hand smoke  exposure.   We will obtain pulmonary function tests and determine need for inhaler therapy.  He does have moderate sleep apnea when sleeping on his back. He mainly sleeps on his side. Positional management for his OSA is ok for now. Will consider trial of CPAP therapy in future should he continue to have these episodes of dyspnea with heat intolerance.   We can consider CPET testing in the future if PFTs are unrevealing.  It  would be helpful if he can take his temperature during these episodes to determine if he has abnormal thermoregulation of his body.  Follow up in 3 months.  Freda Jackson, MD [facility] Office: [phone]   Current Outpatient Medications:    Cholecalciferol 50 MCG (2000 UT) TABS, Take 1 tablet (2,000 Units total) by mouth daily. (Patient not taking: Reported on 11/27/2022), Disp: 90 tablet, Rfl: 1   omeprazole (PRILOSEC) 40 MG capsule, Take 1 capsule (40 mg total) by mouth daily. (Patient not taking: Reported on 11/27/2022), Disp: 30 capsule, Rfl: 0   PEG-KCl-NaCl-NaSulf-Na Asc-C (PLENVU) 140 g SOLR, Take 140 g by mouth as directed. Manufacturer's coupon Universal coupon code:BIN: P2366821; GROUPBK:[id number]; PCN: [id number]; IDDA:[id number]; PAY NO MORE $50, Disp: 1 each, Rfl: 0   Probiotic Product (ALIGN) 4 MG CAPS, Take 1 capsule (4 mg total) by mouth daily. (Patient not taking: Reported on 11/27/2022), Disp: 30 capsule, Rfl: 1   psyllium (METAMUCIL) 58.6 % packet, Take 1 packet by mouth daily., Disp: , Rfl:    sucralfate (CARAFATE) 1 GM/10ML suspension, Take 10 mLs (1 g total) by mouth 4 (four) times daily -  with meals and at bedtime. (Patient not taking: Reported on 11/27/2022), Disp: 420 mL, Rfl: 0

## 2022-12-19 ENCOUNTER — Telehealth: Payer: Medicare HMO | Admitting: Nurse Practitioner

## 2022-12-19 DIAGNOSIS — H66009 Acute suppurative otitis media without spontaneous rupture of ear drum, unspecified ear: Secondary | ICD-10-CM | POA: Diagnosis not present

## 2022-12-19 DIAGNOSIS — J0110 Acute frontal sinusitis, unspecified: Secondary | ICD-10-CM | POA: Diagnosis not present

## 2022-12-19 MED ORDER — AMOXICILLIN-POT CLAVULANATE 250-62.5 MG/5ML PO SUSR: 500.0000 mg | Freq: Two times a day (BID) | ORAL | 0 refills | Status: AC

## 2022-12-19 NOTE — Progress Notes (Signed)
Virtual Visit Consent   Zachary Burke, you are scheduled for a virtual visit with a [facility] provider today. Just as with appointments in the office, your consent must be obtained to participate. Your consent will be active for this visit and any virtual visit you may have with one of our providers in the next 365 days. If you have a MyChart account, a copy of this consent can be sent to you electronically.  As this is a virtual visit, video technology does not allow for your provider to perform a traditional examination. This may limit your provider's ability to fully assess your condition. If your provider identifies any concerns that need to be evaluated in person or the need to arrange testing (such as labs, EKG, etc.), we will make arrangements to do so. Although advances in technology are sophisticated, we cannot ensure that it will always work on either your end or our end. If the connection with a video visit is poor, the visit may have to be switched to a telephone visit. With either a video or telephone visit, we are not always able to ensure that we have a secure connection.  By engaging in this virtual visit, you consent to the provision of healthcare and authorize for your insurance to be billed (if applicable) for the services provided during this visit. Depending on your insurance coverage, you may receive a charge related to this service.  I need to obtain your verbal consent now. Are you willing to proceed with your visit today? Zachary Burke has provided verbal consent on 12/19/2022 for a virtual visit (video or telephone). Apolonio Schneiders, FNP  Date: 12/19/2022 8:25 AM  Virtual Visit via Video Note   I, Apolonio Schneiders, connected with  Zachary Burke  ([id number], 07/17/1951) on 12/19/22 at  8:30 AM EST by a video-enabled telemedicine application and verified that I am speaking with the correct person using two identifiers.  Location: Patient: Virtual Visit Location Patient:  Home Provider: Virtual Visit Location Provider: Home Office   I discussed the limitations of evaluation and management by telemedicine and the availability of in person appointments. The patient expressed understanding and agreed to proceed.    History of Present Illness: Zachary Burke is a 72 y.o. who identifies as a male who was assigned male at birth, and is being seen today for ear pain.   He had COVID 11/15/22 for the first time.  He has been dealing with sinus congestion for the past week with worsening darkening mucous  He has been using sinus irrigations for relief  He uses Flonase daily  He knows that he has narrow eustachian tubes   This morning he woke up with an earache at 4am He had this once in the past   Hot showers are helping  No recent fevers  Problems:  Patient Active Problem List   Diagnosis Date Noted   Need for prophylactic vaccination and inoculation against varicella 11/02/2022   Need for vaccination 11/02/2022   Need for hepatitis C screening test 11/01/2022   Chronic cough 07/04/2022   Peripheral sensory neuropathy 04/25/2022   Bradycardia 04/25/2022   Seasonal allergic rhinitis due to pollen 02/28/2022   Nocturia more than twice per night 11/30/2021   Mild obstructive sleep apnea-hypopnea syndrome 11/30/2021   Chronic sinusitis 09/07/2021   Chronic bilateral low back pain without sciatica 01/31/2021   Vitamin D deficiency disease 07/13/2020   Erectile dysfunction due to arterial insufficiency 07/12/2020   Routine general medical examination at a  health care facility 07/12/2020   Benign prostatic hyperplasia without lower urinary tract symptoms 07/12/2020   Hyperlipidemia LDL goal <130 07/12/2020   PUD (peptic ulcer disease) 07/12/2020   Elevated systolic blood pressure reading without diagnosis of hypertension 07/12/2020   Esophageal reflux 08/21/2010   Personal history of colonic polyps 08/21/2010    Allergies: No Known  Allergies   Medications:  Current Outpatient Medications:    Cholecalciferol 50 MCG (2000 UT) TABS, Take 1 tablet (2,000 Units total) by mouth daily. (Patient not taking: Reported on 11/27/2022), Disp: 90 tablet, Rfl: 1   omeprazole (PRILOSEC) 40 MG capsule, Take 1 capsule (40 mg total) by mouth daily. (Patient not taking: Reported on 11/27/2022), Disp: 30 capsule, Rfl: 0   PEG-KCl-NaCl-NaSulf-Na Asc-C (PLENVU) 140 g SOLR, Take 140 g by mouth as directed. Manufacturer's coupon Universal coupon code:BIN: P2366821; GROUP: [id number]; PCN: [id number]; ID: [id number]; PAY NO MORE $50, Disp: 1 each, Rfl: 0   Probiotic Product (ALIGN) 4 MG CAPS, Take 1 capsule (4 mg total) by mouth daily. (Patient not taking: Reported on 11/27/2022), Disp: 30 capsule, Rfl: 1   psyllium (METAMUCIL) 58.6 % packet, Take 1 packet by mouth daily., Disp: , Rfl:    sucralfate (CARAFATE) 1 GM/10ML suspension, Take 10 mLs (1 g total) by mouth 4 (four) times daily -  with meals and at bedtime. (Patient not taking: Reported on 11/27/2022), Disp: 420 mL, Rfl: 0  Observations/Objective: Patient is well-developed, well-nourished in no acute distress.  Resting comfortably  at home.  Head is normocephalic, atraumatic.  No labored breathing.  Speech is clear and coherent with logical content.  Patient is alert and oriented at baseline.    Assessment and Plan: 1. Non-recurrent acute suppurative otitis media without spontaneous rupture of tympanic membrane, unspecified laterality  - amoxicillin-clavulanate (AUGMENTIN) 250-62.5 MG/5ML suspension; Take 10 mLs (500 mg total) by mouth 2 (two) times daily for 10 days.  Dispense: 200 mL; Refill: 0  2. Acute non-recurrent frontal sinusitis  - amoxicillin-clavulanate (AUGMENTIN) 250-62.5 MG/5ML suspension; Take 10 mLs (500 mg total) by mouth 2 (two) times daily for 10 days.  Dispense: 200 mL; Refill: 0    Follow Up Instructions: I discussed the assessment and treatment plan with the patient. The  patient was provided an opportunity to ask questions and all were answered. The patient agreed with the plan and demonstrated an understanding of the instructions.  A copy of instructions were sent to the patient via MyChart unless otherwise noted below.    The patient was advised to call back or seek an in-person evaluation if the symptoms worsen or if the condition fails to improve as anticipated.  Time:  I spent 15 minutes with the patient via telehealth technology discussing the above problems/concerns.    Apolonio Schneiders, FNP

## 2022-12-20 HISTORY — PX: MOHS SURGERY: SUR867

## 2023-01-01 DIAGNOSIS — C44219 Basal cell carcinoma of skin of left ear and external auricular canal: Secondary | ICD-10-CM | POA: Diagnosis not present

## 2023-01-01 DIAGNOSIS — C44311 Basal cell carcinoma of skin of nose: Secondary | ICD-10-CM | POA: Diagnosis not present

## 2023-01-10 ENCOUNTER — Encounter: Payer: Self-pay | Admitting: Gastroenterology

## 2023-01-16 DIAGNOSIS — L570 Actinic keratosis: Secondary | ICD-10-CM | POA: Diagnosis not present

## 2023-01-18 ENCOUNTER — Encounter: Payer: Self-pay | Admitting: Gastroenterology

## 2023-01-18 ENCOUNTER — Ambulatory Visit (AMBULATORY_SURGERY_CENTER): Payer: Medicare HMO | Admitting: Gastroenterology

## 2023-01-18 VITALS — BP 102/72 | HR 62 | Temp 97.5°F | Resp 12 | Ht 71.5 in | Wt 177.0 lb

## 2023-01-18 DIAGNOSIS — D122 Benign neoplasm of ascending colon: Secondary | ICD-10-CM

## 2023-01-18 DIAGNOSIS — Z86010 Personal history of colonic polyps: Secondary | ICD-10-CM

## 2023-01-18 DIAGNOSIS — K648 Other hemorrhoids: Secondary | ICD-10-CM | POA: Diagnosis not present

## 2023-01-18 DIAGNOSIS — Z09 Encounter for follow-up examination after completed treatment for conditions other than malignant neoplasm: Secondary | ICD-10-CM | POA: Diagnosis not present

## 2023-01-18 DIAGNOSIS — K219 Gastro-esophageal reflux disease without esophagitis: Secondary | ICD-10-CM | POA: Diagnosis not present

## 2023-01-18 MED ORDER — SODIUM CHLORIDE 0.9 % IV SOLN: 500.0000 mL | Freq: Once | INTRAVENOUS | Status: DC

## 2023-01-18 NOTE — Progress Notes (Signed)
Vss nad trans to pacu °

## 2023-01-18 NOTE — Patient Instructions (Signed)
Handouts given on hemorrhoids and polyps.  YOU HAD AN ENDOSCOPIC PROCEDURE TODAY AT [facility]:   Refer to the procedure report that was given to you for any specific questions about what was found during the examination.  If the procedure report does not answer your questions, please call your gastroenterologist to clarify.  If you requested that your care partner not be given the details of your procedure findings, then the procedure report has been included in a sealed envelope for you to review at your convenience later.  YOU SHOULD EXPECT: Some feelings of bloating in the abdomen. Passage of more gas than usual.  Walking can help get rid of the air that was put into your GI tract during the procedure and reduce the bloating. If you had a lower endoscopy (such as a colonoscopy or flexible sigmoidoscopy) you may notice spotting of blood in your stool or on the toilet paper. If you underwent a bowel prep for your procedure, you may not have a normal bowel movement for a few days.  Please Note:  You might notice some irritation and congestion in your nose or some drainage.  This is from the oxygen used during your procedure.  There is no need for concern and it should clear up in a day or so.  SYMPTOMS TO REPORT IMMEDIATELY:  Following lower endoscopy (colonoscopy or flexible sigmoidoscopy):  Excessive amounts of blood in the stool  Significant tenderness or worsening of abdominal pains  Swelling of the abdomen that is new, acute  Fever of 100F or higher   For urgent or emergent issues, a gastroenterologist can be reached at any hour by calling [phone]. Do not use MyChart messaging for urgent concerns.    DIET:  We do recommend a small meal at first, but then you may proceed to your regular diet.  Drink plenty of fluids but you should avoid alcoholic beverages for 24 hours.  ACTIVITY:  You should plan to take it easy for the rest of today and you should NOT DRIVE or  use heavy machinery until tomorrow (because of the sedation medicines used during the test).    FOLLOW UP: Our staff will call the number listed on your records the next business day following your procedure.  We will call around 7:15- 8:00 am to check on you and address any questions or concerns that you may have regarding the information given to you following your procedure. If we do not reach you, we will leave a message.     If any biopsies were taken you will be contacted by phone or by letter within the next 1-3 weeks.  Please call us at [phone] if you have not heard about the biopsies in 3 weeks.    SIGNATURES/CONFIDENTIALITY: You and/or your care partner have signed paperwork which will be entered into your electronic medical record.  These signatures attest to the fact that that the information above on your After Visit Summary has been reviewed and is understood.  Full responsibility of the confidentiality of this discharge information lies with you and/or your care-partner.

## 2023-01-18 NOTE — Progress Notes (Signed)
Called to room to assist during endoscopic procedure.  Patient ID and intended procedure confirmed with present staff. Received instructions for my participation in the procedure from the performing physician.  

## 2023-01-18 NOTE — Progress Notes (Signed)
History and Physical:  This patient presents for endoscopic testing for: Encounter Diagnosis  Name Primary?   Personal history of colonic polyps Yes    TA x 5 (diminutive) Last colonoscopy [facility] Jan 2020 Clinical details of UGI symptoms in my office consult note 11/27/22  Patient is otherwise without complaints or active issues today.   Past Medical History: Past Medical History:  Diagnosis Date   Basal cell carcinoma    Colon polyps    2002,2006   Eczema    GERD (gastroesophageal reflux disease)    Hiatal hernia      Past Surgical History: Past Surgical History:  Procedure Laterality Date   HERNIA REPAIR  2019   MOHS SURGERY  12/2022   nose and ear   NASAL SEPTUM SURGERY      Allergies: No Known Allergies  Outpatient Meds: Current Outpatient Medications  Medication Sig Dispense Refill   Cholecalciferol 50 MCG (2000 UT) TABS Take 1 tablet (2,000 Units total) by mouth daily. 90 tablet 1   psyllium (METAMUCIL) 58.6 % packet Take 1 packet by mouth daily.     omeprazole (PRILOSEC) 40 MG capsule Take 1 capsule (40 mg total) by mouth daily. (Patient not taking: Reported on 11/27/2022) 30 capsule 0   Probiotic Product (ALIGN) 4 MG CAPS Take 1 capsule (4 mg total) by mouth daily. (Patient not taking: Reported on 11/27/2022) 30 capsule 1   sucralfate (CARAFATE) 1 GM/10ML suspension Take 10 mLs (1 g total) by mouth 4 (four) times daily -  with meals and at bedtime. (Patient not taking: Reported on 11/27/2022) 420 mL 0   Current Facility-Administered Medications  Medication Dose Route Frequency Provider Last Rate Last Admin   0.9 %  sodium chloride infusion  500 mL Intravenous Once Danis, Estill Cotta III, MD          ___________________________________________________________________ Objective   Exam:  BP 105/63   Pulse 73   Temp (!) 97.5 F (36.4 C) (Temporal)   Ht 5' 11.5" (1.816 m)   Wt 177 lb (80.3 kg)   SpO2 99%   BMI 24.34 kg/m   CV: regular , S1/S2 Resp: clear  to auscultation bilaterally, normal RR and effort noted GI: soft, no tenderness, with active bowel sounds.   Assessment: Encounter Diagnosis  Name Primary?   Personal history of colonic polyps Yes     Plan: Colonoscopy  The benefits and risks of the planned procedure were described in detail with the patient or (when appropriate) their health care proxy.  Risks were outlined as including, but not limited to, bleeding, infection, perforation, adverse medication reaction leading to cardiac or pulmonary decompensation, pancreatitis (if ERCP).  The limitation of incomplete mucosal visualization was also discussed.  No guarantees or warranties were given.    The patient is appropriate for an endoscopic procedure in the ambulatory setting.   - Wilfrid Lund, MD

## 2023-01-18 NOTE — Op Note (Signed)
[name]: Zachary Burke Procedure Date: 01/18/2023 9:21 AM MRN: [id number] Endoscopist: Cowley. Loletha Carrow , MD, [id number] Age: 72 Referring MD:  Date of Birth: 12/11/1950 Gender: Male Account #: [id number] Procedure:                Colonoscopy Indications:              Surveillance: Personal history of adenomatous                            polyps on last colonoscopy > 3 years ago                           5 diminutive tubular adenomas last colonoscopy                            January 2020 at outside practice. Medicines:                Monitored Anesthesia Care Procedure:                Pre-Anesthesia Assessment:                           - Prior to the procedure, a History and Physical                            was performed, and patient medications and                            allergies were reviewed. The patient's tolerance of                            previous anesthesia was also reviewed. The risks                            and benefits of the procedure and the sedation                            options and risks were discussed with the patient.                            All questions were answered, and informed consent                            was obtained. Prior Anticoagulants: The patient has                            taken no anticoagulant or antiplatelet agents. ASA                            Grade Assessment: II - A patient with mild systemic                            disease. After reviewing the risks and benefits,  the patient was deemed in satisfactory condition to                            undergo the procedure.                           After obtaining informed consent, the colonoscope                            was passed under direct vision. Throughout the                            procedure, the patient's blood pressure, pulse, and                            oxygen saturations were monitored  continuously. The                            CF [id number] was introduced through the anus                            and advanced to the the cecum, identified by                            appendiceal orifice and ileocecal valve. The                            colonoscopy was performed with difficulty due to a                            redundant colon and significant looping. Successful                            completion of the procedure was aided by using                            manual pressure and straightening and shortening                            the scope to obtain bowel loop reduction. The                            patient tolerated the procedure well. The quality                            of the bowel preparation was good except the cecum                            and proximal ascending colon were fair (fibrous                            debris that could not be completely cleared). The  ileocecal valve, appendiceal orifice, and rectum                            were photographed. The bowel preparation used was                            SUPREP via split dose instruction. Scope In: 9:35:06 AM Scope Out: 9:52:51 AM Scope Withdrawal Time: 0 hours 11 minutes 52 seconds  Total Procedure Duration: 0 hours 17 minutes 45 seconds  Findings:                 Hemorrhoids were found on perianal exam.                            (Prolapsing internal hemorrhoid column)                           Repeat examination of right colon under NBI                            performed.                           The sigmoid colon was redundant.                           A diminutive polyp was found in the proximal                            ascending colon. The polyp was sessile. The polyp                            was removed with a cold snare. Resection and                            retrieval were complete.                           Internal hemorrhoids were  found. The hemorrhoids                            were Grade II (internal hemorrhoids that prolapse                            but reduce spontaneously).                           The exam was otherwise without abnormality on                            direct and retroflexion views. Complications:            No immediate complications. Estimated Blood Loss:     Estimated blood loss was minimal. Impression:               - Hemorrhoids found on perianal exam.                           -  Redundant colon.                           - One diminutive polyp in the proximal ascending                            colon, removed with a cold snare. Resected and                            retrieved.                           - Internal hemorrhoids.                           - The examination was otherwise normal on direct                            and retroflexion views. Recommendation:           - Patient has a contact number available for                            emergencies. The signs and symptoms of potential                            delayed complications were discussed with the                            patient. Return to normal activities tomorrow.                            Written discharge instructions were provided to the                            patient.                           - Resume previous diet.                           - Continue present medications.                           - Await pathology results.                           - Repeat colonoscopy in 5 years for surveillance.                            (For next colonoscopy, close attention to                            preprocedure dietary restrictions and consume more                            water with prep) Mallie Mussel L. Loletha Carrow, MD 01/18/2023 9:58:20 AM  This report has been signed electronically.

## 2023-01-21 ENCOUNTER — Telehealth: Payer: Self-pay

## 2023-01-21 NOTE — Telephone Encounter (Signed)
No answer, left message to call if having any issues or concerns, B.Zachary Burke.

## 2023-01-23 ENCOUNTER — Encounter: Payer: Self-pay | Admitting: Gastroenterology

## 2023-02-05 ENCOUNTER — Telehealth: Payer: Self-pay

## 2023-02-05 NOTE — Telephone Encounter (Signed)
Called patient to schedule Medicare Annual Wellness Visit (AWV). Left message for patient to call back and schedule Medicare Annual Wellness Visit (AWV).  Last date of AWV: 08/29/21  Please schedule an appointment at any time.   Norton Blizzard, [place] ([facility])  [facility] [facility] [phone]

## 2023-02-06 NOTE — Telephone Encounter (Signed)
Patient returned call from [place]. He said he will absolutely not do an AWV. Patient's callback is [phone].

## 2023-03-19 DIAGNOSIS — H5203 Hypermetropia, bilateral: Secondary | ICD-10-CM | POA: Diagnosis not present

## 2023-03-19 DIAGNOSIS — H2513 Age-related nuclear cataract, bilateral: Secondary | ICD-10-CM | POA: Diagnosis not present

## 2023-03-21 DIAGNOSIS — Z85828 Personal history of other malignant neoplasm of skin: Secondary | ICD-10-CM | POA: Diagnosis not present

## 2023-03-21 DIAGNOSIS — D1801 Hemangioma of skin and subcutaneous tissue: Secondary | ICD-10-CM | POA: Diagnosis not present

## 2023-03-21 DIAGNOSIS — L812 Freckles: Secondary | ICD-10-CM | POA: Diagnosis not present

## 2023-03-21 DIAGNOSIS — L821 Other seborrheic keratosis: Secondary | ICD-10-CM | POA: Diagnosis not present

## 2023-03-21 DIAGNOSIS — L570 Actinic keratosis: Secondary | ICD-10-CM | POA: Diagnosis not present

## 2023-03-21 DIAGNOSIS — D225 Melanocytic nevi of trunk: Secondary | ICD-10-CM | POA: Diagnosis not present

## 2023-04-04 ENCOUNTER — Encounter: Payer: Self-pay | Admitting: Family Medicine

## 2023-04-04 ENCOUNTER — Ambulatory Visit (INDEPENDENT_AMBULATORY_CARE_PROVIDER_SITE_OTHER): Payer: Medicare HMO | Admitting: Family Medicine

## 2023-04-04 VITALS — BP 132/72 | HR 85 | Temp 97.6°F | Ht 71.5 in | Wt 169.0 lb

## 2023-04-04 DIAGNOSIS — R42 Dizziness and giddiness: Secondary | ICD-10-CM

## 2023-04-04 LAB — CBC WITH DIFFERENTIAL/PLATELET
Basophils Absolute: 0 10*3/uL (ref 0.0–0.1)
Basophils Relative: 0.7 % (ref 0.0–3.0)
Eosinophils Absolute: 0.4 10*3/uL (ref 0.0–0.7)
Eosinophils Relative: 6.2 % — ABNORMAL HIGH (ref 0.0–5.0)
HCT: 45.8 % (ref 39.0–52.0)
Hemoglobin: 15.2 g/dL (ref 13.0–17.0)
Lymphocytes Relative: 22.7 % (ref 12.0–46.0)
Lymphs Abs: 1.4 10*3/uL (ref 0.7–4.0)
MCHC: 33.3 g/dL (ref 30.0–36.0)
MCV: 88.8 fl (ref 78.0–100.0)
Monocytes Absolute: 0.6 10*3/uL (ref 0.1–1.0)
Monocytes Relative: 9.7 % (ref 3.0–12.0)
Neutro Abs: 3.6 10*3/uL (ref 1.4–7.7)
Neutrophils Relative %: 60.7 % (ref 43.0–77.0)
Platelets: 243 10*3/uL (ref 150.0–400.0)
RBC: 5.16 Mil/uL (ref 4.22–5.81)
RDW: 13.5 % (ref 11.5–15.5)
WBC: 6 10*3/uL (ref 4.0–10.5)

## 2023-04-04 LAB — BASIC METABOLIC PANEL
BUN: 13 mg/dL (ref 6–23)
CO2: 30 mEq/L (ref 19–32)
Calcium: 9.5 mg/dL (ref 8.4–10.5)
Chloride: 102 mEq/L (ref 96–112)
Creatinine, Ser: 0.99 mg/dL (ref 0.40–1.50)
GFR: 76.75 mL/min (ref >60.00)
Glucose, Bld: 85 mg/dL (ref 70–99)
Potassium: 4.4 mEq/L (ref 3.5–5.1)
Sodium: 138 mEq/L (ref 135–145)

## 2023-04-04 NOTE — Progress Notes (Signed)
Subjective:     Patient ID: Zachary Burke, male    DOB: 03/07/1951, 72 y.o.   MRN: [id number]  Chief Complaint  Patient presents with   Dizziness    Last night had bad dizzy spell, came on suddenly. When laid on stomach while doing exercise reports whole room spinning   Chills    When he went to bed the dizziness stopped but couldn't seem to get warm. When he went to get up in the middle of the night to urinate he was drenched in sweat    HPI Patient is in today for intermittent dizziness with certain position changes. Spinning sensation.  Denies dizziness today. Well hydrated.   Denies fever, headache, vision changes, sinus pain, ear pain, ST, chest pain, palpitations, shortness of breath, abdominal pain, N/V/D.    Health Maintenance Due  Topic Date Due   Zoster Vaccines- Shingrix (1 of 2) Never done   Medicare Annual Wellness (AWV)  08/29/2022   COVID-19 Vaccine (6 - 2023-24 season) 09/29/2022    Past Medical History:  Diagnosis Date   Basal cell carcinoma    Colon polyps    2002,2006   Eczema    GERD (gastroesophageal reflux disease)    Hiatal hernia     Past Surgical History:  Procedure Laterality Date   HERNIA REPAIR  2019   MOHS SURGERY  12/2022   nose and ear   NASAL SEPTUM SURGERY      Family History  Problem Relation Age of Onset   COPD Mother    Colon cancer Paternal Aunt    Prostate cancer Paternal Uncle    Esophageal cancer Paternal Grandfather        passed away at 21   Rectal cancer Neg Hx    Stomach cancer Neg Hx     Social History   Socioeconomic History   Marital status: Married    Spouse name: Lanora Manis   Number of children: 3   Years of education: Not on file   Highest education level: Not on file  Occupational History   Occupation: Investment banker, corporate: [employer]  Tobacco Use   Smoking status: Never    Passive exposure: Past   Smokeless tobacco: Never  Vaping Use   Vaping Use: Never used  Substance and Sexual  Activity   Alcohol use: Yes   Drug use: No   Sexual activity: Not on file  Other Topics Concern   Not on file  Social History Narrative   Lives with wife   Right handed   Caffeine: half a cup of coffee every morning   Social Determinants of Health   Financial Resource Strain: Low Risk  (08/29/2021)   Overall Financial Resource Strain (CARDIA)    Difficulty of Paying Living Expenses: Not hard at all  Food Insecurity: No Food Insecurity (08/29/2021)   Hunger Vital Sign    Worried About Running Out of Food in the Last Year: Never true    Ran Out of Food in the Last Year: Never true  Transportation Needs: No Transportation Needs (08/29/2021)   PRAPARE - Administrator, Civil Service (Medical): No    Lack of Transportation (Non-Medical): No  Physical Activity: Sufficiently Active (08/29/2021)   Exercise Vital Sign    Days of Exercise per Week: 7 days    Minutes of Exercise per Session: 60 min  Stress: No Stress Concern Present (08/29/2021)   [employer] of Occupational Health - Occupational Stress Questionnaire  Feeling of Stress : Not at all  Social Connections: Socially Integrated (08/29/2021)   Social Connection and Isolation Panel [NHANES]    Frequency of Communication with Friends and Family: More than three times a week    Frequency of Social Gatherings with Friends and Family: More than three times a week    Attends Religious Services: More than 4 times per year    Active Member of [employer] or Organizations: Yes    Attends Engineer, structural: More than 4 times per year    Marital Status: Married  Catering manager Violence: Not At Risk (08/29/2021)   Humiliation, Afraid, Rape, and Kick questionnaire    Fear of Current or Ex-Partner: No    Emotionally Abused: No    Physically Abused: No    Sexually Abused: No    Outpatient Medications Prior to Visit  Medication Sig Dispense Refill   psyllium (METAMUCIL) 58.6 % packet Take 1 packet by mouth  daily.     Cholecalciferol 50 MCG (2000 UT) TABS Take 1 tablet (2,000 Units total) by mouth daily. (Patient not taking: Reported on 04/04/2023) 90 tablet 1   omeprazole (PRILOSEC) 40 MG capsule Take 1 capsule (40 mg total) by mouth daily. (Patient not taking: Reported on 11/27/2022) 30 capsule 0   Probiotic Product (ALIGN) 4 MG CAPS Take 1 capsule (4 mg total) by mouth daily. (Patient not taking: Reported on 11/27/2022) 30 capsule 1   sucralfate (CARAFATE) 1 GM/10ML suspension Take 10 mLs (1 g total) by mouth 4 (four) times daily -  with meals and at bedtime. (Patient not taking: Reported on 11/27/2022) 420 mL 0   No facility-administered medications prior to visit.    No Known Allergies  ROS     Objective:    Physical Exam Constitutional:      General: He is not in acute distress.    Appearance: He is not ill-appearing.  HENT:     Head: Atraumatic.     Right Ear: Tympanic membrane and ear canal normal.     Left Ear: Tympanic membrane and ear canal normal.     Nose: Nose normal. No congestion.     Mouth/Throat:     Mouth: Mucous membranes are moist.     Pharynx: Oropharynx is clear.  Eyes:     General: Lids are normal. Gaze aligned appropriately.     Extraocular Movements: Extraocular movements intact.     Right eye: No nystagmus.     Left eye: No nystagmus.     Conjunctiva/sclera: Conjunctivae normal.     Pupils: Pupils are equal, round, and reactive to light.  Neck:     Vascular: No carotid bruit or JVD.  Cardiovascular:     Rate and Rhythm: Normal rate and regular rhythm.  Pulmonary:     Effort: Pulmonary effort is normal.     Breath sounds: Normal breath sounds.  Musculoskeletal:     Cervical back: Normal range of motion and neck supple.  Lymphadenopathy:     Cervical: No cervical adenopathy.  Skin:    General: Skin is warm and dry.  Neurological:     General: No focal deficit present.     Mental Status: He is alert and oriented to person, place, and time.     Cranial  Nerves: No cranial nerve deficit or facial asymmetry.     Sensory: No sensory deficit.     Motor: No weakness, abnormal muscle tone or pronator drift.     Coordination: Coordination normal.  Gait: Gait normal.  Psychiatric:        Mood and Affect: Mood normal.        Speech: Speech normal.        Behavior: Behavior normal.        Thought Content: Thought content normal.     BP 132/72 (BP Location: Left Arm, Patient Position: Sitting, Cuff Size: Large)   Pulse 85   Temp 97.6 F (36.4 C) (Temporal)   Ht 5' 11.5" (1.816 m)   Wt 169 lb (76.7 kg)   SpO2 99%   BMI 23.24 kg/m  Wt Readings from Last 3 Encounters:  04/04/23 169 lb (76.7 kg)  01/18/23 177 lb (80.3 kg)  12/05/22 178 lb (80.7 kg)       Assessment & Plan:   Problem List Items Addressed This Visit   None Visit Diagnoses     Dizziness    -  Primary   Relevant Orders   Basic metabolic panel (Completed)   CBC with Differential/Platelet (Completed)      No red flag symptoms. Benign neuro exam.  He is not orthostatic.  Most likely BPPV. Symptoms currently resolved.  Check labs. Discussed meclizine if symptoms return or referral to vestibular rehab.   I am having [name] maintain his psyllium, Cholecalciferol, Align, sucralfate, and omeprazole.  No orders of the defined types were placed in this encounter.

## 2023-04-04 NOTE — Patient Instructions (Addendum)
Your dizziness sounds like positional vertigo.   We will check your labs to look for an underlying issue.   Change positions slowly.   You can try meclizine if you change your mind. It is over the counter.   We can also refer you to physical therapy for rehab if symptoms persist.     Benign Positional Vertigo Vertigo is the feeling that you or your surroundings are moving when they are not. Benign positional vertigo is the most common form of vertigo. This is usually a harmless condition (benign). This condition is positional. This means that symptoms are triggered by certain movements and positions. This condition can be dangerous if it occurs while you are doing something that could cause harm to yourself or others. This includes activities such as driving or operating machinery. What are the causes? The inner ear has fluid-filled canals that help your brain sense movement and balance. When the fluid moves, the brain receives messages about your body's position. With benign positional vertigo, calcium crystals in the inner ear break free and disturb the inner ear area. This causes your brain to receive confusing messages about your body's position. What increases the risk? You are more likely to develop this condition if: You are a woman. You are 71 years of age or older. You have recently had a head injury. You have an inner ear disease. What are the signs or symptoms? Symptoms of this condition usually happen when you move your head or your eyes in different directions. Symptoms may start suddenly and usually last for less than a minute. They include: Loss of balance and falling. Feeling like you are spinning or moving. Feeling like your surroundings are spinning or moving. Nausea and vomiting. Blurred vision. Dizziness. Involuntary eye movement (nystagmus). Symptoms can be mild and cause only minor problems, or they can be severe and interfere with daily life. Episodes of benign  positional vertigo may return (recur) over time. Symptoms may also improve over time. How is this diagnosed? This condition may be diagnosed based on: Your medical history. A physical exam of the head, neck, and ears. Positional tests to check for or stimulate vertigo. You may be asked to turn your head and change positions, such as going from sitting to lying down. A health care provider will watch for symptoms of vertigo. You may be referred to a health care provider who specializes in ear, nose, and throat problems (ENT or otolaryngologist) or a provider who specializes in disorders of the nervous system (neurologist). How is this treated?  This condition may be treated in a session in which your health care provider moves your head in specific positions to help the displaced crystals in your inner ear move. Treatment for this condition may take several sessions. Surgery may be needed in severe cases, but this is rare. In some cases, benign positional vertigo may resolve on its own in 2-4 weeks. Follow these instructions at home: Safety Move slowly. Avoid sudden body or head movements or certain positions, as told by your health care provider. Avoid driving or operating machinery until your health care provider says it is safe. Avoid doing any tasks that would be dangerous to you or others if vertigo occurs. If you have trouble walking or keeping your balance, try using a cane for stability. If you feel dizzy or unstable, sit down right away. Return to your normal activities as told by your health care provider. Ask your health care provider what activities are safe for  you. General instructions Take over-the-counter and prescription medicines only as told by your health care provider. Drink enough fluid to keep your urine pale yellow. Keep all follow-up visits. This is important. Contact a health care provider if: You have a fever. Your condition gets worse or you develop new  symptoms. Your family or friends notice any behavioral changes. You have nausea or vomiting that gets worse. You have numbness or a prickling and tingling sensation. Get help right away if you: Have difficulty speaking or moving. Are always dizzy or faint. Develop severe headaches. Have weakness in your legs or arms. Have changes in your hearing or vision. Develop a stiff neck. Develop sensitivity to light. These symptoms may represent a serious problem that is an emergency. Do not wait to see if the symptoms will go away. Get medical help right away. Call your local emergency services (911 in the [place] Do not drive yourself to the hospital. Summary Vertigo is the feeling that you or your surroundings are moving when they are not. Benign positional vertigo is the most common form of vertigo. This condition is caused by calcium crystals in the inner ear that become displaced. This causes a disturbance in an area of the inner ear that helps your brain sense movement and balance. Symptoms include loss of balance and falling, feeling that you or your surroundings are moving, nausea and vomiting, and blurred vision. This condition can be diagnosed based on symptoms, a physical exam, and positional tests. Follow safety instructions as told by your health care provider and keep all follow-up visits. This is important. This information is not intended to replace advice given to you by your health care provider. Make sure you discuss any questions you have with your health care provider. Document Revised: 10/05/2020 Document Reviewed: 10/05/2020 Elsevier Patient Education  2023 [employer].

## 2023-04-05 ENCOUNTER — Encounter: Payer: Self-pay | Admitting: Family Medicine

## 2023-07-10 IMAGING — US US PELVIS LIMITED
1 series · 14 of 24 positions shown · non-contrast
Comparison: None Available.

CLINICAL DATA: Right groin tenderness history of inguinal hernia
repair

EXAM:
LIMITED ULTRASOUND OF PELVIS
TECHNIQUE: Limited transabdominal ultrasound examination of the pelvis was
performed.

[Series 1: us pelvis limited · 0.07mm/px · 24 acquisitions, 14 frames shown]
[im 1/24]
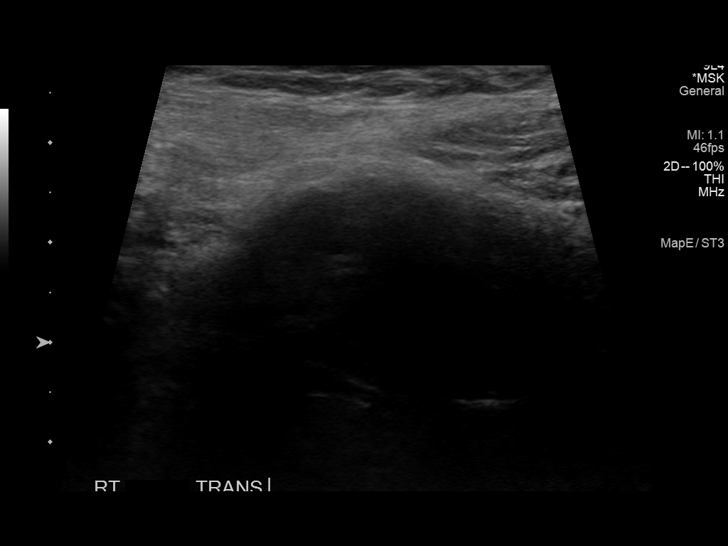
[im 3/24]
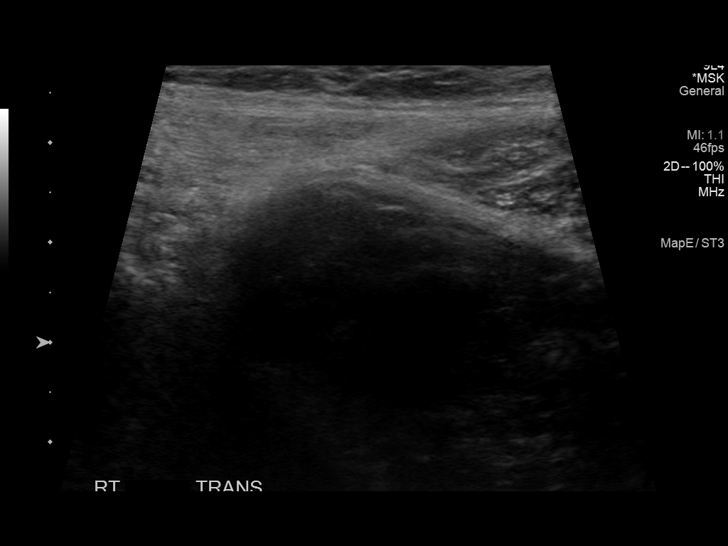
[im 5/24]
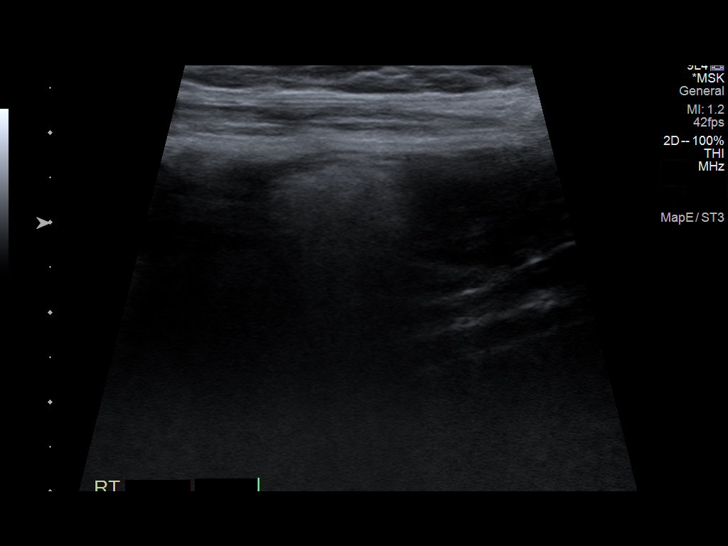
[im 7/24]
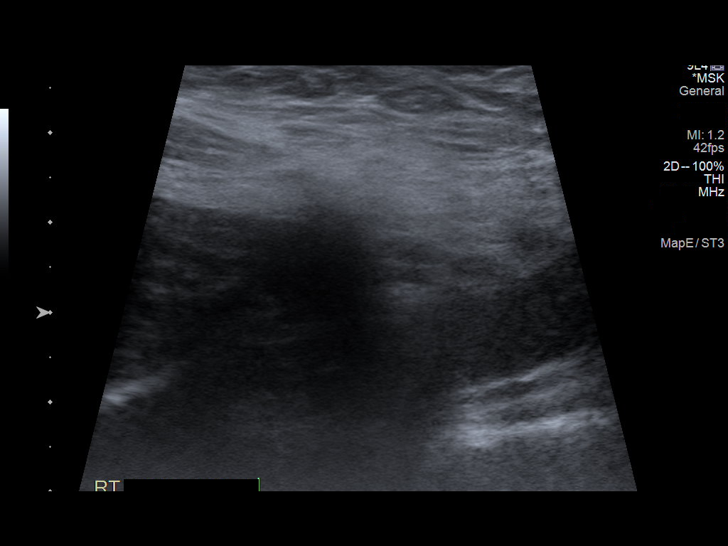
[im 8/24]
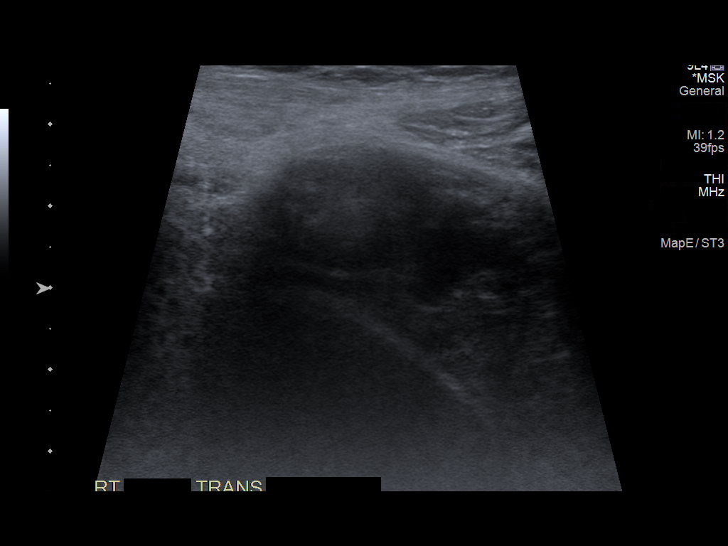
[im 10/24]
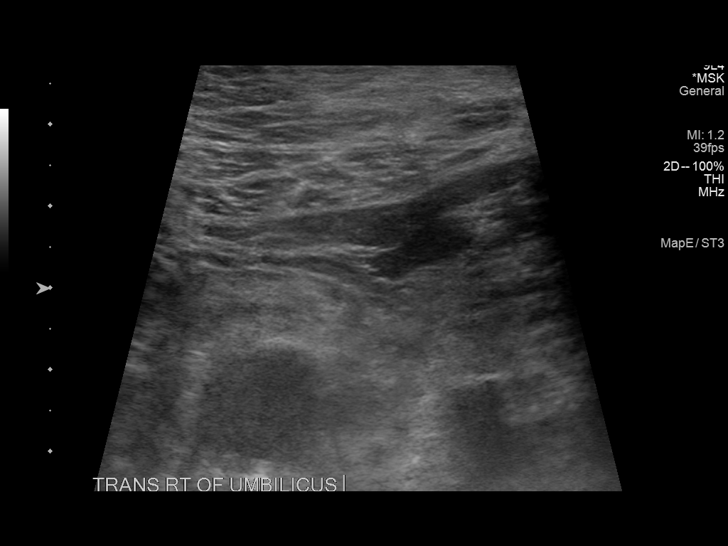
[im 12/24]
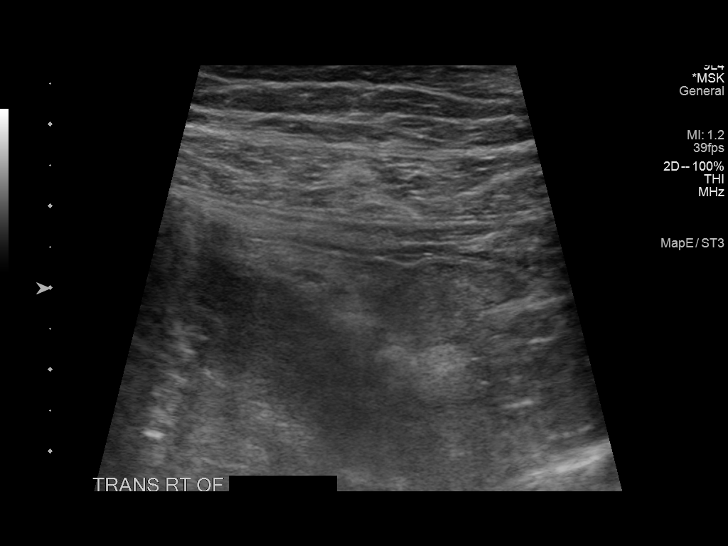
[im 13/24]
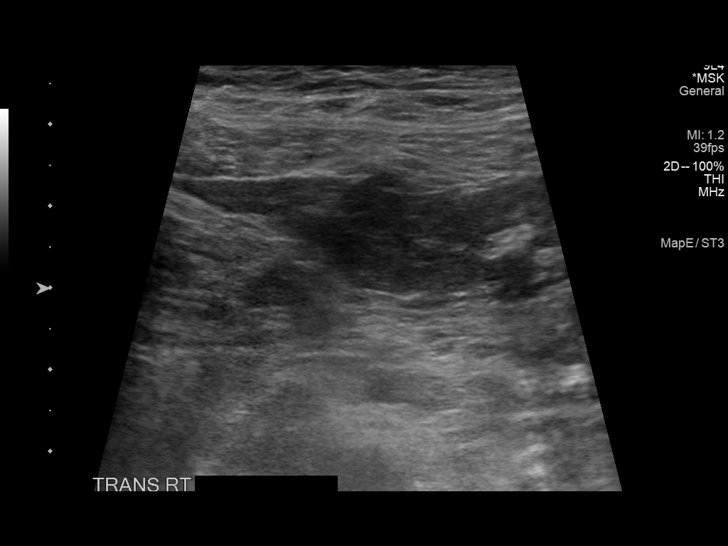
[im 15/24]
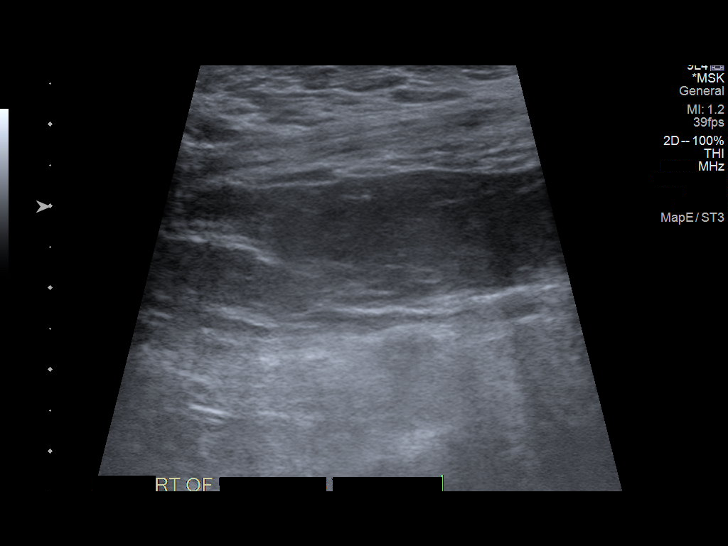
[im 17/24]
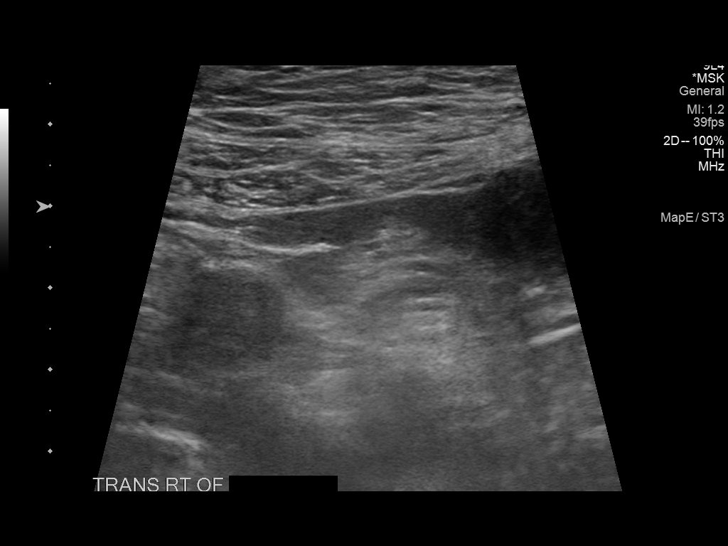
[im 19/24]
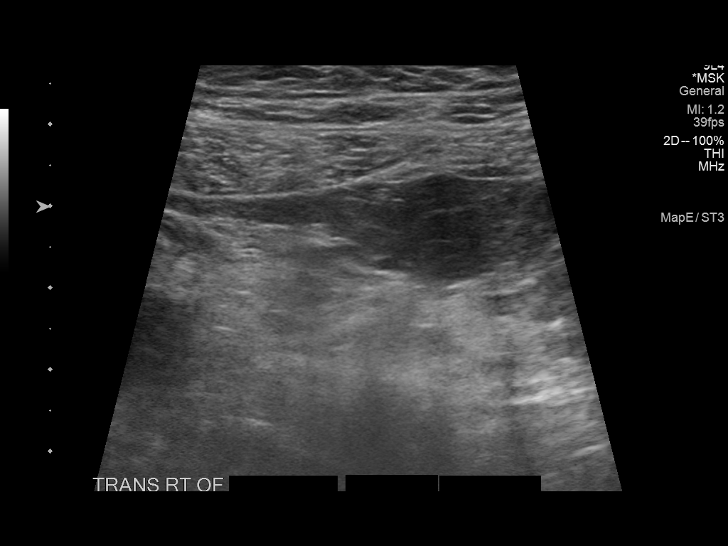
[im 20/24]
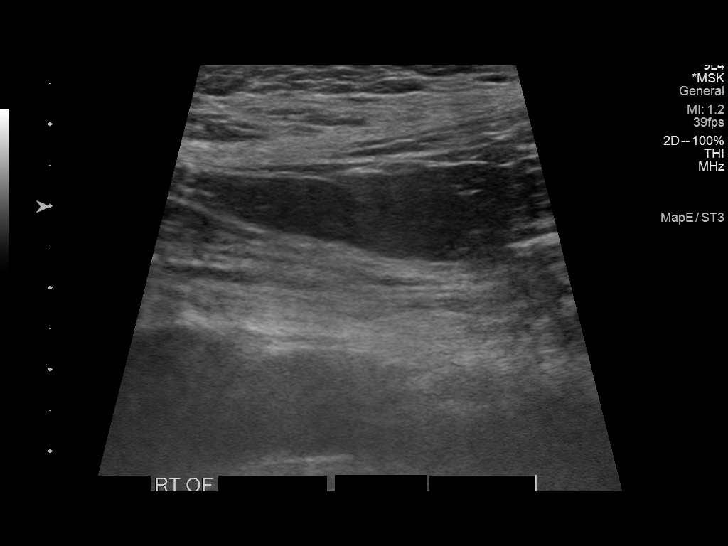
[im 22/24]
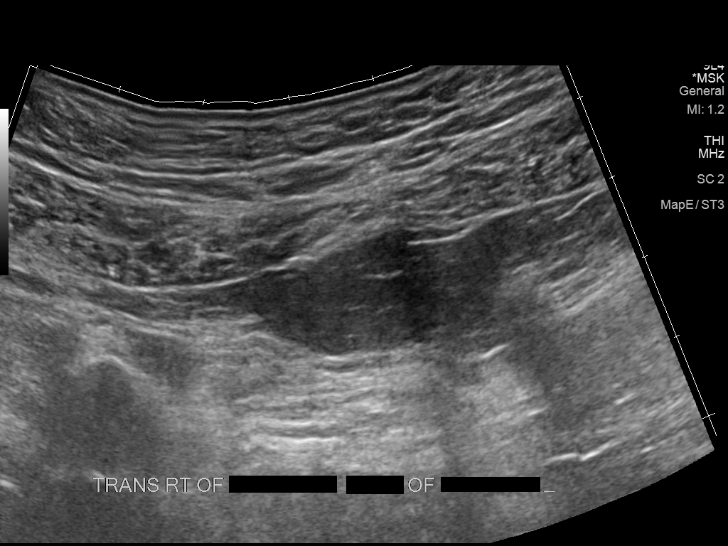
[im 24/24]
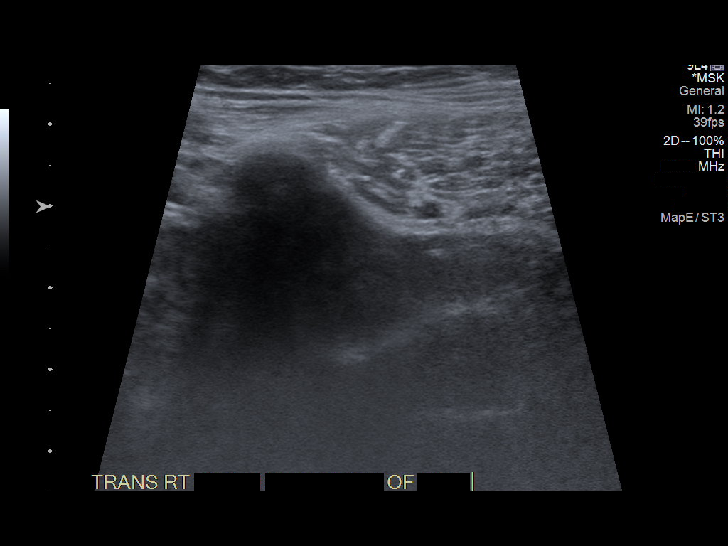

[14 of 24 positions shown; findings below may reference images not displayed]

FINDINGS: Targeted ultrasound of the right groin performed with and without
Valsalva. Additional sonography of right umbilical region performed
in the region of concern. Hypoechoic area over the right groin,
question mesh material related to prior hernia repair. No definite
bowel containing hernia seen in the right groin. No definite
sonographic correlate for right periumbilical discomfort.
IMPRESSION: 1. Hypoechoic area with shadowing in the right groin in the region
of concern, question mesh material related to prior hernia repair.
No definite bowel containing right groin hernia, CT could be
considered for further evaluation as indicated
2. No definite sonographic correlate for right periumbilical
tenderness, recommend clinical management.

## 2023-07-25 DIAGNOSIS — C44319 Basal cell carcinoma of skin of other parts of face: Secondary | ICD-10-CM | POA: Diagnosis not present

## 2023-07-25 DIAGNOSIS — L570 Actinic keratosis: Secondary | ICD-10-CM | POA: Diagnosis not present

## 2023-07-25 DIAGNOSIS — D485 Neoplasm of uncertain behavior of skin: Secondary | ICD-10-CM | POA: Diagnosis not present

## 2023-08-01 ENCOUNTER — Ambulatory Visit (INDEPENDENT_AMBULATORY_CARE_PROVIDER_SITE_OTHER): Payer: Medicare HMO | Admitting: Family Medicine

## 2023-08-01 ENCOUNTER — Encounter: Payer: Self-pay | Admitting: Family Medicine

## 2023-08-01 VITALS — BP 126/84 | HR 65 | Temp 97.6°F | Wt 176.4 lb

## 2023-08-01 DIAGNOSIS — Z23 Encounter for immunization: Secondary | ICD-10-CM

## 2023-08-01 DIAGNOSIS — H60542 Acute eczematoid otitis externa, left ear: Secondary | ICD-10-CM | POA: Diagnosis not present

## 2023-08-01 MED ORDER — NEOMYCIN-POLYMYXIN-HC 1 % OT SOLN: 3.0000 [drp] | Freq: Four times a day (QID) | OTIC | 0 refills | Status: DC

## 2023-08-01 NOTE — Progress Notes (Signed)
Subjective:  Zachary Burke is a 72 y.o. male who presents for left ear canal itching.   He is a Counselling psychologist. Usually Flonase which helps with congestion and no recent ear infections.   Denies fever, chills, dizziness, headache, ear pain, congestion, chest pain, palpitations, shortness of breath.      Objective: Vitals:   08/01/23 0914 08/01/23 0940  BP: (!) 140/86 126/84  Pulse: 65   Temp: 97.6 F (36.4 C)   SpO2: 98%     General appearance: Alert, WD/WN, no distress                             Skin: warm, no rash                           Head: no sinus tenderness                            Eyes: conjunctiva normal, corneas clear, PERRLA                            Ears: pearly TMs, left ear canal with erythema and mild edema. Right ear canal                          Nose: septum midline, turbinates swollen, with erythema and clear discharge             Mouth/throat: MMM, tongue normal                           Neck: supple, no adenopathy, no thyromegaly, nontender                                               Assessment: Dermatitis of ear canal, left - Plan: NEOMYCIN-POLYMYXIN-HYDROCORTISONE (CORTISPORIN) 1 % SOLN OTIC solution  Need for influenza vaccination - Plan: Flu Vaccine Trivalent High Dose (Fluad)   Plan: Topical therapy prescribed. Follow up if not improving.

## 2023-08-07 DIAGNOSIS — L570 Actinic keratosis: Secondary | ICD-10-CM | POA: Diagnosis not present

## 2023-08-07 DIAGNOSIS — C4441 Basal cell carcinoma of skin of scalp and neck: Secondary | ICD-10-CM | POA: Diagnosis not present

## 2023-08-07 DIAGNOSIS — D485 Neoplasm of uncertain behavior of skin: Secondary | ICD-10-CM | POA: Diagnosis not present

## 2023-09-04 DIAGNOSIS — C44319 Basal cell carcinoma of skin of other parts of face: Secondary | ICD-10-CM | POA: Diagnosis not present

## 2023-09-04 DIAGNOSIS — C4441 Basal cell carcinoma of skin of scalp and neck: Secondary | ICD-10-CM | POA: Diagnosis not present

## 2023-09-25 DIAGNOSIS — Z85828 Personal history of other malignant neoplasm of skin: Secondary | ICD-10-CM | POA: Diagnosis not present

## 2023-09-25 DIAGNOSIS — L812 Freckles: Secondary | ICD-10-CM | POA: Diagnosis not present

## 2023-09-25 DIAGNOSIS — D1801 Hemangioma of skin and subcutaneous tissue: Secondary | ICD-10-CM | POA: Diagnosis not present

## 2023-09-25 DIAGNOSIS — L570 Actinic keratosis: Secondary | ICD-10-CM | POA: Diagnosis not present

## 2023-10-14 ENCOUNTER — Encounter: Payer: Self-pay | Admitting: Internal Medicine

## 2023-10-21 ENCOUNTER — Other Ambulatory Visit: Payer: Self-pay | Admitting: Internal Medicine

## 2023-10-21 DIAGNOSIS — M722 Plantar fascial fibromatosis: Secondary | ICD-10-CM | POA: Insufficient documentation

## 2023-10-29 ENCOUNTER — Encounter: Payer: Self-pay | Admitting: Internal Medicine

## 2023-10-29 DIAGNOSIS — Z85828 Personal history of other malignant neoplasm of skin: Secondary | ICD-10-CM | POA: Diagnosis not present

## 2023-10-29 DIAGNOSIS — L738 Other specified follicular disorders: Secondary | ICD-10-CM | POA: Diagnosis not present

## 2023-10-30 ENCOUNTER — Ambulatory Visit: Payer: Medicare HMO | Admitting: Podiatry

## 2023-10-30 ENCOUNTER — Ambulatory Visit (INDEPENDENT_AMBULATORY_CARE_PROVIDER_SITE_OTHER): Payer: Medicare HMO

## 2023-10-30 DIAGNOSIS — M722 Plantar fascial fibromatosis: Secondary | ICD-10-CM

## 2023-10-30 DIAGNOSIS — M778 Other enthesopathies, not elsewhere classified: Secondary | ICD-10-CM | POA: Diagnosis not present

## 2023-10-30 NOTE — Progress Notes (Signed)
Subjective:  Patient ID: Zachary Burke, male    DOB: 1951-04-17,  MRN: [id number]  Chief Complaint  Patient presents with   Foot Pain    Left foot pain. He reports he uses ice, spiky ball and helps some. His wife requested an xray of foot.     72 y.o. male presents with the above complaint.  Patient presents with left heel pain.  Patient stated that excruciatingly gotten worse over the last few weeks but since then has gotten much better.  He has been doing ice and spiky ball which helped some.  He would like to discuss conservative treatment and noninvasive treatments for plantar fasciitis.  He denies seeing anyone else prior to seeing me pain scale 4 out of 10 dull aching nature.   Review of Systems: Negative except as noted in the HPI. Denies N/V/F/Ch.  Past Medical History:  Diagnosis Date   Basal cell carcinoma    Colon polyps    2002,2006   Eczema    GERD (gastroesophageal reflux disease)    Hiatal hernia     Current Outpatient Medications:    Cholecalciferol 50 MCG (2000 UT) TABS, Take 1 tablet (2,000 Units total) by mouth daily., Disp: 90 tablet, Rfl: 1   NEOMYCIN-POLYMYXIN-HYDROCORTISONE (CORTISPORIN) 1 % SOLN OTIC solution, Place 3 drops into the left ear every 6 (six) hours., Disp: 10 mL, Rfl: 0   omeprazole (PRILOSEC) 40 MG capsule, Take 1 capsule (40 mg total) by mouth daily., Disp: 30 capsule, Rfl: 0   Probiotic Product (ALIGN) 4 MG CAPS, Take 1 capsule (4 mg total) by mouth daily., Disp: 30 capsule, Rfl: 1   psyllium (METAMUCIL) 58.6 % packet, Take 1 packet by mouth daily., Disp: , Rfl:    sucralfate (CARAFATE) 1 GM/10ML suspension, Take 10 mLs (1 g total) by mouth 4 (four) times daily -  with meals and at bedtime. (Patient not taking: Reported on 11/27/2022), Disp: 420 mL, Rfl: 0  Social History   Tobacco Use  Smoking Status Never   Passive exposure: Past  Smokeless Tobacco Never    No Known Allergies Objective:  There were no vitals filed for this  visit. There is no height or weight on file to calculate BMI. Constitutional Well developed. Well nourished.  Vascular Dorsalis pedis pulses palpable bilaterally. Posterior tibial pulses palpable bilaterally. Capillary refill normal to all digits.  No cyanosis or clubbing noted. Pedal hair growth normal.  Neurologic Normal speech. Oriented to person, place, and time. Epicritic sensation to light touch grossly present bilaterally.  Dermatologic Nails well groomed and normal in appearance. No open wounds. No skin lesions.  Orthopedic: Normal joint ROM without pain or crepitus bilaterally. No visible deformities. Tender to palpation at the calcaneal tuber left. No pain with calcaneal squeeze left. Ankle ROM diminished range of motion left. Silfverskiold Test: positive left.   Radiographs: Taken and reviewed. No acute fractures or dislocations. No evidence of stress fracture.  Plantar heel spur present. Posterior heel spur absent.   Assessment:   1. Plantar fasciitis of left foot    Plan:  Patient was evaluated and treated and all questions answered.  Plantar Fasciitis, left -X-rays reviewed as above.  All questions and concerns were discussed with the patient in extensive detail.  At this time patient's pain is improved considerably.  I discussed shoe gear modification as well as orthotics management.  He would like to work on over-the-counter orthotics with power steps.  If there is no improvement we will discuss custom orthotics.  No follow-ups on file.

## 2023-11-04 ENCOUNTER — Encounter: Payer: Self-pay | Admitting: Internal Medicine

## 2023-11-04 ENCOUNTER — Ambulatory Visit: Payer: Medicare HMO | Admitting: Internal Medicine

## 2023-11-04 VITALS — BP 126/74 | HR 56 | Temp 97.6°F | Ht 71.5 in | Wt 180.2 lb

## 2023-11-04 DIAGNOSIS — K279 Peptic ulcer, site unspecified, unspecified as acute or chronic, without hemorrhage or perforation: Secondary | ICD-10-CM | POA: Diagnosis not present

## 2023-11-04 DIAGNOSIS — N401 Enlarged prostate with lower urinary tract symptoms: Secondary | ICD-10-CM

## 2023-11-04 DIAGNOSIS — R001 Bradycardia, unspecified: Secondary | ICD-10-CM | POA: Diagnosis not present

## 2023-11-04 DIAGNOSIS — E785 Hyperlipidemia, unspecified: Secondary | ICD-10-CM | POA: Diagnosis not present

## 2023-11-04 DIAGNOSIS — Z0001 Encounter for general adult medical examination with abnormal findings: Secondary | ICD-10-CM

## 2023-11-04 DIAGNOSIS — R351 Nocturia: Secondary | ICD-10-CM

## 2023-11-04 DIAGNOSIS — Z23 Encounter for immunization: Secondary | ICD-10-CM

## 2023-11-04 LAB — HEPATIC FUNCTION PANEL
ALT: 13 U/L (ref 0–53)
AST: 19 U/L (ref 0–37)
Albumin: 4.4 g/dL (ref 3.5–5.2)
Alkaline Phosphatase: 62 U/L (ref 39–117)
Bilirubin, Direct: 0.2 mg/dL (ref 0.0–0.3)
Total Bilirubin: 0.9 mg/dL (ref 0.2–1.2)
Total Protein: 7.3 g/dL (ref 6.0–8.3)

## 2023-11-04 LAB — LIPID PANEL
Cholesterol: 155 mg/dL (ref 0–200)
HDL: 46 mg/dL (ref >39.00)
LDL Cholesterol: 92 mg/dL (ref 0–99)
NonHDL: 109.37
Total CHOL/HDL Ratio: 3
Triglycerides: 88 mg/dL (ref 0.0–149.0)
VLDL: 17.6 mg/dL (ref 0.0–40.0)

## 2023-11-04 LAB — BASIC METABOLIC PANEL
BUN: 16 mg/dL (ref 6–23)
CO2: 29 meq/L (ref 19–32)
Calcium: 9.2 mg/dL (ref 8.4–10.5)
Chloride: 100 meq/L (ref 96–112)
Creatinine, Ser: 0.93 mg/dL (ref 0.40–1.50)
GFR: 82.39 mL/min (ref >60.00)
Glucose, Bld: 91 mg/dL (ref 70–99)
Potassium: 4.2 meq/L (ref 3.5–5.1)
Sodium: 137 meq/L (ref 135–145)

## 2023-11-04 LAB — CBC WITH DIFFERENTIAL/PLATELET
Basophils Absolute: 0 10*3/uL (ref 0.0–0.1)
Basophils Relative: 0.7 % (ref 0.0–3.0)
Eosinophils Absolute: 0.1 10*3/uL (ref 0.0–0.7)
Eosinophils Relative: 2.5 % (ref 0.0–5.0)
HCT: 45.8 % (ref 39.0–52.0)
Hemoglobin: 15.3 g/dL (ref 13.0–17.0)
Lymphocytes Relative: 21.6 % (ref 12.0–46.0)
Lymphs Abs: 1.2 10*3/uL (ref 0.7–4.0)
MCHC: 33.4 g/dL (ref 30.0–36.0)
MCV: 90.5 fL (ref 78.0–100.0)
Monocytes Absolute: 0.6 10*3/uL (ref 0.1–1.0)
Monocytes Relative: 11.7 % (ref 3.0–12.0)
Neutro Abs: 3.5 10*3/uL (ref 1.4–7.7)
Neutrophils Relative %: 63.5 % (ref 43.0–77.0)
Platelets: 207 10*3/uL (ref 150.0–400.0)
RBC: 5.06 Mil/uL (ref 4.22–5.81)
RDW: 13.9 % (ref 11.5–15.5)
WBC: 5.4 10*3/uL (ref 4.0–10.5)

## 2023-11-04 LAB — PSA: PSA: 2.16 ng/mL (ref 0.10–4.00)

## 2023-11-04 LAB — TSH: TSH: 1.8 u[IU]/mL (ref 0.35–5.50)

## 2023-11-04 MED ORDER — SHINGRIX 50 MCG/0.5ML IM SUSR: 0.5000 mL | Freq: Once | INTRAMUSCULAR | 1 refills | Status: AC

## 2023-11-04 NOTE — Progress Notes (Unsigned)
Subjective:  Patient ID: Zachary Burke, male    DOB: 1951-06-11  Age: 72 y.o. MRN: [id number]  CC: Annual Exam and Gastroesophageal Reflux   HPI Zachary Burke presents for a CPX and f/up ---  Outpatient Medications Prior to Visit  Medication Sig Dispense Refill   Cholecalciferol 50 MCG (2000 UT) TABS Take 1 tablet (2,000 Units total) by mouth daily. 90 tablet 1   Probiotic Product (ALIGN) 4 MG CAPS Take 1 capsule (4 mg total) by mouth daily. 30 capsule 1   psyllium (METAMUCIL) 58.6 % packet Take 1 packet by mouth daily.     NEOMYCIN-POLYMYXIN-HYDROCORTISONE (CORTISPORIN) 1 % SOLN OTIC solution Place 3 drops into the left ear every 6 (six) hours. 10 mL 0   omeprazole (PRILOSEC) 40 MG capsule Take 1 capsule (40 mg total) by mouth daily. 30 capsule 0   sucralfate (CARAFATE) 1 GM/10ML suspension Take 10 mLs (1 g total) by mouth 4 (four) times daily -  with meals and at bedtime. 420 mL 0   No facility-administered medications prior to visit.    ROS Review of Systems  Objective:  BP 126/74 (BP Location: Left Arm, Patient Position: Sitting, Cuff Size: Normal)   Pulse (!) 56   Temp 97.6 F (36.4 C) (Oral)   Ht 5' 11.5" (1.816 m)   Wt 180 lb 3.2 oz (81.7 kg)   SpO2 98%   BMI 24.78 kg/m   BP Readings from Last 3 Encounters:  11/04/23 126/74  08/01/23 126/84  04/04/23 132/72    Wt Readings from Last 3 Encounters:  11/04/23 180 lb 3.2 oz (81.7 kg)  08/01/23 176 lb 6.4 oz (80 kg)  04/04/23 169 lb (76.7 kg)    Physical Exam Vitals reviewed.  Constitutional:      Appearance: Normal appearance.  HENT:     Nose: Nose normal.     Mouth/Throat:     Mouth: Mucous membranes are moist.  Eyes:     General: No scleral icterus.    Conjunctiva/sclera: Conjunctivae normal.  Cardiovascular:     Rate and Rhythm: Regular rhythm. Bradycardia present.     Heart sounds: No murmur heard.    No friction rub. No gallop.     Comments: EKG- SB, 51 bpm Anterior infarct pattern is  old Voltage loss in III is new No LVH Pulmonary:     Effort: Pulmonary effort is normal.     Breath sounds: No stridor. No wheezing, rhonchi or rales.  Abdominal:     General: Abdomen is flat.     Palpations: There is no mass.     Tenderness: There is no abdominal tenderness. There is no guarding.     Hernia: No hernia is present.  Musculoskeletal:        General: Normal range of motion.     Cervical back: Neck supple.     Right lower leg: No edema.     Left lower leg: No edema.  Lymphadenopathy:     Cervical: No cervical adenopathy.  Skin:    General: Skin is warm and dry.  Neurological:     General: No focal deficit present.     Mental Status: He is alert. Mental status is at baseline.  Psychiatric:        Mood and Affect: Mood normal.        Behavior: Behavior normal.     Lab Results  Component Value Date   WBC 6.0 04/04/2023   HGB 15.2 04/04/2023   HCT 45.8 04/04/2023  PLT 243.0 04/04/2023   GLUCOSE 85 04/04/2023   CHOL 155 11/01/2022   TRIG 90.0 11/01/2022   HDL 45.70 11/01/2022   LDLCALC 91 11/01/2022   ALT 14 11/01/2022   AST 20 11/01/2022   NA 138 04/04/2023   K 4.4 04/04/2023   CL 102 04/04/2023   CREATININE 0.99 04/04/2023   BUN 13 04/04/2023   CO2 30 04/04/2023   TSH 2.04 04/25/2022   PSA 1.91 11/01/2022   HGBA1C 5.5 07/12/2020    US PELVIS LIMITED (TRANSABDOMINAL ONLY) Result Date: 04/09/2022 CLINICAL DATA:  Right groin tenderness history of inguinal hernia repair EXAM: LIMITED ULTRASOUND OF PELVIS TECHNIQUE: Limited transabdominal ultrasound examination of the pelvis was performed. COMPARISON:  None Available. FINDINGS: Targeted ultrasound of the right groin performed with and without Valsalva. Additional sonography of right umbilical region performed in the region of concern. Hypoechoic area over the right groin, question mesh material related to prior hernia repair. No definite bowel containing hernia seen in the right groin. No definite  sonographic correlate for right periumbilical discomfort. IMPRESSION: 1. Hypoechoic area with shadowing in the right groin in the region of concern, question mesh material related to prior hernia repair. No definite bowel containing right groin hernia, CT could be considered for further evaluation as indicated 2. No definite sonographic correlate for right periumbilical tenderness, recommend clinical management. Electronically Signed   By: Jasmine Pang M.D.   On: 04/09/2022 23:34    Assessment & Plan:  Hyperlipidemia LDL goal <130 -     TSH; Future -     Hepatic function panel; Future -     Lipid panel; Future  Bradycardia -     TSH; Future -     EKG 12-Lead -     Basic metabolic panel; Future  BPH associated with nocturia -     PSA; Future -     Urinalysis, Routine w reflex microscopic; Future  PUD (peptic ulcer disease) -     CBC with Differential/Platelet; Future -     Basic metabolic panel; Future  Need for prophylactic vaccination and inoculation against varicella -     Shingrix; Inject 0.5 mLs into the muscle once for 1 dose.  Dispense: 0.5 mL; Refill: 1  Encounter for general adult medical examination with abnormal findings     Follow-up: Return in about 6 months (around 05/04/2024).  Sanda Linger, MD

## 2023-11-04 NOTE — Patient Instructions (Signed)
Health Maintenance, Male Adopting a healthy lifestyle and getting preventive care are important in promoting health and wellness. Ask your health care provider about: The right schedule for you to have regular tests and exams. Things you can do on your own to prevent diseases and keep yourself healthy. What should I know about diet, weight, and exercise? Eat a healthy diet  Eat a diet that includes plenty of vegetables, fruits, low-fat dairy products, and lean protein. Do not eat a lot of foods that are high in solid fats, added sugars, or sodium. Maintain a healthy weight Body mass index (BMI) is a measurement that can be used to identify possible weight problems. It estimates body fat based on height and weight. Your health care provider can help determine your BMI and help you achieve or maintain a healthy weight. Get regular exercise Get regular exercise. This is one of the most important things you can do for your health. Most adults should: Exercise for at least 150 minutes each week. The exercise should increase your heart rate and make you sweat (moderate-intensity exercise). Do strengthening exercises at least twice a week. This is in addition to the moderate-intensity exercise. Spend less time sitting. Even light physical activity can be beneficial. Watch cholesterol and blood lipids Have your blood tested for lipids and cholesterol at 71 years of age, then have this test every 5 years. You may need to have your cholesterol levels checked more often if: Your lipid or cholesterol levels are high. You are older than 71 years of age. You are at high risk for heart disease. What should I know about cancer screening? Many types of cancers can be detected early and may often be prevented. Depending on your health history and family history, you may need to have cancer screening at various ages. This may include screening for: Colorectal cancer. Prostate cancer. Skin cancer. Lung  cancer. What should I know about heart disease, diabetes, and high blood pressure? Blood pressure and heart disease High blood pressure causes heart disease and increases the risk of stroke. This is more likely to develop in people who have high blood pressure readings or are overweight. Talk with your health care provider about your target blood pressure readings. Have your blood pressure checked: Every 3-5 years if you are 18-39 years of age. Every year if you are 40 years old or older. If you are between the ages of 65 and 75 and are a current or former smoker, ask your health care provider if you should have a one-time screening for abdominal aortic aneurysm (AAA). Diabetes Have regular diabetes screenings. This checks your fasting blood sugar level. Have the screening done: Once every three years after age 45 if you are at a normal weight and have a low risk for diabetes. More often and at a younger age if you are overweight or have a high risk for diabetes. What should I know about preventing infection? Hepatitis B If you have a higher risk for hepatitis B, you should be screened for this virus. Talk with your health care provider to find out if you are at risk for hepatitis B infection. Hepatitis C Blood testing is recommended for: Everyone born from 1945 through 1965. Anyone with known risk factors for hepatitis C. Sexually transmitted infections (STIs) You should be screened each year for STIs, including gonorrhea and chlamydia, if: You are sexually active and are younger than 71 years of age. You are older than 71 years of age and your   health care provider tells you that you are at risk for this type of infection. Your sexual activity has changed since you were last screened, and you are at increased risk for chlamydia or gonorrhea. Ask your health care provider if you are at risk. Ask your health care provider about whether you are at high risk for HIV. Your health care provider  may recommend a prescription medicine to help prevent HIV infection. If you choose to take medicine to prevent HIV, you should first get tested for HIV. You should then be tested every 3 months for as long as you are taking the medicine. Follow these instructions at home: Alcohol use Do not drink alcohol if your health care provider tells you not to drink. If you drink alcohol: Limit how much you have to 0-2 drinks a day. Know how much alcohol is in your drink. In the [place] one drink equals one 12 oz bottle of beer (355 mL), one 5 oz glass of wine (148 mL), or one 1 oz glass of hard liquor (44 mL). Lifestyle Do not use any products that contain nicotine or tobacco. These products include cigarettes, chewing tobacco, and vaping devices, such as e-cigarettes. If you need help quitting, ask your health care provider. Do not use street drugs. Do not share needles. Ask your health care provider for help if you need support or information about quitting drugs. General instructions Schedule regular health, dental, and eye exams. Stay current with your vaccines. Tell your health care provider if: You often feel depressed. You have ever been abused or do not feel safe at home. Summary Adopting a healthy lifestyle and getting preventive care are important in promoting health and wellness. Follow your health care provider's instructions about healthy diet, exercising, and getting tested or screened for diseases. Follow your health care provider's instructions on monitoring your cholesterol and blood pressure. This information is not intended to replace advice given to you by your health care provider. Make sure you discuss any questions you have with your health care provider. Document Revised: 03/27/2021 Document Reviewed: 03/27/2021 Elsevier Patient Education  2024 Elsevier Inc.  

## 2023-11-05 LAB — URINALYSIS, ROUTINE W REFLEX MICROSCOPIC
Bilirubin Urine: NEGATIVE
Hgb urine dipstick: NEGATIVE
Ketones, ur: NEGATIVE
Leukocytes,Ua: NEGATIVE
Nitrite: NEGATIVE
RBC / HPF: NONE SEEN (ref 0–?)
Specific Gravity, Urine: <=1.005 — AB (ref 1.000–1.030)
Total Protein, Urine: NEGATIVE
Urine Glucose: NEGATIVE
Urobilinogen, UA: 0.2 (ref 0.0–1.0)
pH: 6.5 (ref 5.0–8.0)

## 2023-11-14 ENCOUNTER — Telehealth: Payer: Medicare HMO

## 2023-11-14 DIAGNOSIS — J019 Acute sinusitis, unspecified: Secondary | ICD-10-CM | POA: Diagnosis not present

## 2023-11-14 DIAGNOSIS — B9689 Other specified bacterial agents as the cause of diseases classified elsewhere: Secondary | ICD-10-CM

## 2023-11-14 MED ORDER — AMOXICILLIN-POT CLAVULANATE 250-62.5 MG/5ML PO SUSR: 500.0000 mg | Freq: Two times a day (BID) | ORAL | 0 refills | Status: DC

## 2023-11-14 NOTE — Progress Notes (Signed)
Virtual Visit Consent   Zachary Burke, you are scheduled for a virtual visit with a [facility] provider today. Just as with appointments in the office, your consent must be obtained to participate. Your consent will be active for this visit and any virtual visit you may have with one of our providers in the next 365 days. If you have a MyChart account, a copy of this consent can be sent to you electronically.  As this is a virtual visit, video technology does not allow for your provider to perform a traditional examination. This may limit your provider's ability to fully assess your condition. If your provider identifies any concerns that need to be evaluated in person or the need to arrange testing (such as labs, EKG, etc.), we will make arrangements to do so. Although advances in technology are sophisticated, we cannot ensure that it will always work on either your end or our end. If the connection with a video visit is poor, the visit may have to be switched to a telephone visit. With either a video or telephone visit, we are not always able to ensure that we have a secure connection.  By engaging in this virtual visit, you consent to the provision of healthcare and authorize for your insurance to be billed (if applicable) for the services provided during this visit. Depending on your insurance coverage, you may receive a charge related to this service.  I need to obtain your verbal consent now. Are you willing to proceed with your visit today? Shadarius Jeske has provided verbal consent on 11/14/2023 for a virtual visit (video or telephone). [name], PA-C  Date: 11/14/2023 8:01 AM  Virtual Visit via Video Note   I, [name], connected with  Zachary Burke  ([id number], [birth year]) on 11/14/23 at  8:00 AM EST by a video-enabled telemedicine application and verified that I am speaking with the correct person using two identifiers.  Location: Patient: Virtual Visit  Location Patient: Home Provider: Virtual Visit Location Provider: Home Office   I discussed the limitations of evaluation and management by telemedicine and the availability of in person appointments. The patient expressed understanding and agreed to proceed.    History of Present Illness: Zachary Burke is a 72 y.o. who identifies as a male who was assigned male at birth, and is being seen today for sinus congestion and drainage.  HPI: Sinusitis This is a new problem. The current episode started 1 to 4 weeks ago (2 weeks). The problem has been gradually worsening since onset. Maximum temperature: low grade, 99. The pain is moderate. Associated symptoms include congestion, coughing (has improved some), ear pain, headaches, sinus pressure and a sore throat (from drainage). Pertinent negatives include no chills, hoarse voice, shortness of breath or swollen glands. (Rhinorrhea, post nasal drainage) Treatments tried: flonase, tylenol. The treatment provided no relief.     Problems:  Patient Active Problem List   Diagnosis Date Noted   Encounter for general adult medical examination with abnormal findings 11/04/2023   Plantar fasciitis 10/21/2023   Need for prophylactic vaccination and inoculation against varicella 11/02/2022   Peripheral sensory neuropathy 04/25/2022   Bradycardia 04/25/2022   Seasonal allergic rhinitis due to pollen 02/28/2022   BPH associated with nocturia 11/30/2021   Mild obstructive sleep apnea-hypopnea syndrome 11/30/2021   Chronic sinusitis 09/07/2021   Chronic bilateral low back pain without sciatica 01/31/2021   Vitamin D deficiency disease 07/13/2020   Erectile dysfunction due to arterial insufficiency 07/12/2020   Hyperlipidemia  LDL goal <130 07/12/2020   PUD (peptic ulcer disease) 07/12/2020   Esophageal reflux 08/21/2010   History of colonic polyps 08/21/2010    Allergies: No Known Allergies Medications:  Current Outpatient Medications:     amoxicillin-clavulanate (AUGMENTIN) 250-62.5 MG/5ML suspension, Take 10 mLs (500 mg total) by mouth 2 (two) times daily., Disp: 200 mL, Rfl: 0   Cholecalciferol 50 MCG (2000 UT) TABS, Take 1 tablet (2,000 Units total) by mouth daily., Disp: 90 tablet, Rfl: 1   Probiotic Product (ALIGN) 4 MG CAPS, Take 1 capsule (4 mg total) by mouth daily., Disp: 30 capsule, Rfl: 1   psyllium (METAMUCIL) 58.6 % packet, Take 1 packet by mouth daily., Disp: , Rfl:   Observations/Objective: Patient is well-developed, well-nourished in no acute distress.  Resting comfortably at home.  Head is normocephalic, atraumatic.  No labored breathing.  Speech is clear and coherent with logical content.  Patient is alert and oriented at baseline.    Assessment and Plan: 1. Acute bacterial sinusitis (Primary) - amoxicillin-clavulanate (AUGMENTIN) 250-62.5 MG/5ML suspension; Take 10 mLs (500 mg total) by mouth 2 (two) times daily.  Dispense: 200 mL; Refill: 0  - Worsening symptoms that have not responded to OTC medications.  - Will give Augmentin - Continue allergy medications.  - Steam and humidifier can help - Stay well hydrated and get plenty of rest.  - Seek in person evaluation if no symptom improvement or if symptoms worsen   Follow Up Instructions: I discussed the assessment and treatment plan with the patient. The patient was provided an opportunity to ask questions and all were answered. The patient agreed with the plan and demonstrated an understanding of the instructions.  A copy of instructions were sent to the patient via MyChart unless otherwise noted below.    The patient was advised to call back or seek an in-person evaluation if the symptoms worsen or if the condition fails to improve as anticipated.    [name], PA-C

## 2023-11-14 NOTE — Patient Instructions (Signed)
Zachary Burke, thank you for joining Zachary Loveless, PA-C for today's virtual visit.  While this provider is not your primary care provider (PCP), if your PCP is located in our provider database this encounter information will be shared with them immediately following your visit.   A [facility] MyChart account gives you access to today's visit and all your visits, tests, and labs performed at [facility] " click here if you don't have a [facility] MyChart account or go to [email or web address]  Consent: (Patient) Zachary Burke provided verbal consent for this virtual visit at the beginning of the encounter.  Current Medications:  Current Outpatient Medications:    amoxicillin-clavulanate (AUGMENTIN) 250-62.5 MG/5ML suspension, Take 10 mLs (500 mg total) by mouth 2 (two) times daily., Disp: 200 mL, Rfl: 0   Cholecalciferol 50 MCG (2000 UT) TABS, Take 1 tablet (2,000 Units total) by mouth daily., Disp: 90 tablet, Rfl: 1   Probiotic Product (ALIGN) 4 MG CAPS, Take 1 capsule (4 mg total) by mouth daily., Disp: 30 capsule, Rfl: 1   psyllium (METAMUCIL) 58.6 % packet, Take 1 packet by mouth daily., Disp: , Rfl:    Medications ordered in this encounter:  Meds ordered this encounter  Medications   amoxicillin-clavulanate (AUGMENTIN) 250-62.5 MG/5ML suspension    Sig: Take 10 mLs (500 mg total) by mouth 2 (two) times daily.    Dispense:  200 mL    Refill:  0    Supervising Provider:   Merrilee Jansky [[id number]]     *If you need refills on other medications prior to your next appointment, please contact your pharmacy*  Follow-Up: Call back or seek an in-person evaluation if the symptoms worsen or if the condition fails to improve as anticipated.  [facility] [phone]  Other Instructions Sinus Infection, Adult A sinus infection, also called sinusitis, is inflammation of your sinuses. Sinuses are hollow spaces in the bones around your face. Your  sinuses are located: Around your eyes. In the middle of your forehead. Behind your nose. In your cheekbones. Mucus normally drains out of your sinuses. When your nasal tissues become inflamed or swollen, mucus can become trapped or blocked. This allows bacteria, viruses, and fungi to grow, which leads to infection. Most infections of the sinuses are caused by a virus. A sinus infection can develop quickly. It can last for up to 4 weeks (acute) or for more than 12 weeks (chronic). A sinus infection often develops after a cold. What are the causes? This condition is caused by anything that creates swelling in the sinuses or stops mucus from draining. This includes: Allergies. Asthma. Infection from bacteria or viruses. Deformities or blockages in your nose or sinuses. Abnormal growths in the nose (nasal polyps). Pollutants, such as chemicals or irritants in the air. Infection from fungi. This is rare. What increases the risk? You are more likely to develop this condition if you: Have a weak body defense system (immune system). Do a lot of swimming or diving. Overuse nasal sprays. Smoke. What are the signs or symptoms? The main symptoms of this condition are pain and a feeling of pressure around the affected sinuses. Other symptoms include: Stuffy nose or congestion that makes it difficult to breathe through your nose. Thick yellow or greenish drainage from your nose. Tenderness, swelling, and warmth over the affected sinuses. A cough that may get worse at night. Decreased sense of smell and taste. Extra mucus that collects in the throat or the back of  the nose (postnasal drip) causing a sore throat or bad breath. Tiredness (fatigue). Fever. How is this diagnosed? This condition is diagnosed based on: Your symptoms. Your medical history. A physical exam. Tests to find out if your condition is acute or chronic. This may include: Checking your nose for nasal polyps. Viewing your  sinuses using a device that has a light (endoscope). Testing for allergies or bacteria. Imaging tests, such as an MRI or CT scan. In rare cases, a bone biopsy may be done to rule out more serious types of fungal sinus disease. How is this treated? Treatment for a sinus infection depends on the cause and whether your condition is chronic or acute. If caused by a virus, your symptoms should go away on their own within 10 days. You may be given medicines to relieve symptoms. They include: Medicines that shrink swollen nasal passages (decongestants). A spray that eases inflammation of the nostrils (topical intranasal corticosteroids). Rinses that help get rid of thick mucus in your nose (nasal saline washes). Medicines that treat allergies (antihistamines). Over-the-counter pain relievers. If caused by bacteria, your health care provider may recommend waiting to see if your symptoms improve. Most bacterial infections will get better without antibiotic medicine. You may be given antibiotics if you have: A severe infection. A weak immune system. If caused by narrow nasal passages or nasal polyps, surgery may be needed. Follow these instructions at home: Medicines Take, use, or apply over-the-counter and prescription medicines only as told by your health care provider. These may include nasal sprays. If you were prescribed an antibiotic medicine, take it as told by your health care provider. Do not stop taking the antibiotic even if you start to feel better. Hydrate and humidify  Drink enough fluid to keep your urine pale yellow. Staying hydrated will help to thin your mucus. Use a cool mist humidifier to keep the humidity level in your home above 50%. Inhale steam for 10-15 minutes, 3-4 times a day, or as told by your health care provider. You can do this in the bathroom while a hot shower is running. Limit your exposure to cool or dry air. Rest Rest as much as possible. Sleep with your head  raised (elevated). Make sure you get enough sleep each night. General instructions  Apply a warm, moist washcloth to your face 3-4 times a day or as told by your health care provider. This will help with discomfort. Use nasal saline washes as often as told by your health care provider. Wash your hands often with soap and water to reduce your exposure to germs. If soap and water are not available, use hand sanitizer. Do not smoke. Avoid being around people who are smoking (secondhand smoke). Keep all follow-up visits. This is important. Contact a health care provider if: You have a fever. Your symptoms get worse. Your symptoms do not improve within 10 days. Get help right away if: You have a severe headache. You have persistent vomiting. You have severe pain or swelling around your face or eyes. You have vision problems. You develop confusion. Your neck is stiff. You have trouble breathing. These symptoms may be an emergency. Get help right away. Call 911. Do not wait to see if the symptoms will go away. Do not drive yourself to the hospital. Summary A sinus infection is soreness and inflammation of your sinuses. Sinuses are hollow spaces in the bones around your face. This condition is caused by nasal tissues that become inflamed or swollen. The swelling  traps or blocks the flow of mucus. This allows bacteria, viruses, and fungi to grow, which leads to infection. If you were prescribed an antibiotic medicine, take it as told by your health care provider. Do not stop taking the antibiotic even if you start to feel better. Keep all follow-up visits. This is important. This information is not intended to replace advice given to you by your health care provider. Make sure you discuss any questions you have with your health care provider. Document Revised: 10/10/2021 Document Reviewed: 10/10/2021 Elsevier Patient Education  2024 Elsevier Inc.    If you have been instructed to have an  in-person evaluation today at a local Urgent Care facility, please use the link below. It will take you to a list of all of our available [facility], including address, phone number and hours of operation. Please do not delay care.  [facility]  If you or a family member do not have a primary care provider, use the link below to schedule a visit and establish care. When you choose a [facility] primary care physician or advanced practice provider, you gain a long-term partner in health. Find a Primary Care Provider  Learn more about [facility] in-office and virtual care options: [facility] Get Care Now

## 2024-01-27 DIAGNOSIS — Z85828 Personal history of other malignant neoplasm of skin: Secondary | ICD-10-CM | POA: Diagnosis not present

## 2024-01-27 DIAGNOSIS — L820 Inflamed seborrheic keratosis: Secondary | ICD-10-CM | POA: Diagnosis not present

## 2024-01-27 DIAGNOSIS — L570 Actinic keratosis: Secondary | ICD-10-CM | POA: Diagnosis not present

## 2024-01-27 DIAGNOSIS — L821 Other seborrheic keratosis: Secondary | ICD-10-CM | POA: Diagnosis not present

## 2024-05-21 ENCOUNTER — Telehealth: Admitting: Physician Assistant

## 2024-05-21 DIAGNOSIS — B9689 Other specified bacterial agents as the cause of diseases classified elsewhere: Secondary | ICD-10-CM | POA: Diagnosis not present

## 2024-05-21 DIAGNOSIS — J019 Acute sinusitis, unspecified: Secondary | ICD-10-CM | POA: Diagnosis not present

## 2024-05-21 MED ORDER — PROMETHAZINE-DM 6.25-15 MG/5ML PO SYRP: 5.0000 mL | ORAL_SOLUTION | Freq: Four times a day (QID) | ORAL | 0 refills | Status: DC | PRN

## 2024-05-21 MED ORDER — AMOXICILLIN-POT CLAVULANATE 400-57 MG/5ML PO SUSR: ORAL | 0 refills | Status: DC

## 2024-05-21 NOTE — Progress Notes (Signed)
 Virtual Visit Consent   [name], you are scheduled for a virtual visit with a [facility] provider today. Just as with appointments in the office, your consent must be obtained to participate. Your consent will be active for this visit and any virtual visit you may have with one of our providers in the next 365 days. If you have a MyChart account, a copy of this consent can be sent to you electronically.  As this is a virtual visit, video technology does not allow for your provider to perform a traditional examination. This may limit your provider's ability to fully assess your condition. If your provider identifies any concerns that need to be evaluated in person or the need to arrange testing (such as labs, EKG, etc.), we will make arrangements to do so. Although advances in technology are sophisticated, we cannot ensure that it will always work on either your end or our end. If the connection with a video visit is poor, the visit may have to be switched to a telephone visit. With either a video or telephone visit, we are not always able to ensure that we have a secure connection.  By engaging in this virtual visit, you consent to the provision of healthcare and authorize for your insurance to be billed (if applicable) for the services provided during this visit. Depending on your insurance coverage, you may receive a charge related to this service.  I need to obtain your verbal consent now. Are you willing to proceed with your visit today? Zachary Burke has provided verbal consent on 05/21/2024 for a virtual visit (video or telephone). Zachary Burke, [place]  Date: 05/21/2024 9:35 AM   Virtual Visit via Video Note   I, Zachary Burke, connected with  Zachary Burke  ([id number], 05/18/1951) on 05/21/24 at  9:30 AM EDT by a video-enabled telemedicine application and verified that I am speaking with the correct person using two identifiers.  Location: Patient: Virtual Visit Location  Patient: Home Provider: Virtual Visit Location Provider: Home Office   I discussed the limitations of evaluation and management by telemedicine and the availability of in person appointments. The patient expressed understanding and agreed to proceed.    History of Present Illness: Zachary Burke is a 73 y.o. who identifies as a male who was assigned male at birth, and is being seen today for possible sinusitis. Patient endorses symptoms starting 2 weeks with nasal congestion and post nasal drip progressing into sinus pressure, sinus pain and change in nasal discharge to thick, yellow, with persistent cough. Now with low-grade fever and upper tooth pain.  OTC -- Sinumed saline rinse  HPI: HPI  Problems:  Patient Active Problem List   Diagnosis Date Noted   Encounter for general adult medical examination with abnormal findings 11/04/2023   Plantar fasciitis 10/21/2023   Need for prophylactic vaccination and inoculation against varicella 11/02/2022   Peripheral sensory neuropathy 04/25/2022   Bradycardia 04/25/2022   Seasonal allergic rhinitis due to pollen 02/28/2022   BPH associated with nocturia 11/30/2021   Mild obstructive sleep apnea-hypopnea syndrome 11/30/2021   Chronic sinusitis 09/07/2021   Chronic bilateral low back pain without sciatica 01/31/2021   Vitamin D  deficiency disease 07/13/2020   Erectile dysfunction due to arterial insufficiency 07/12/2020   Hyperlipidemia LDL goal <130 07/12/2020   PUD (peptic ulcer disease) 07/12/2020   Esophageal reflux 08/21/2010   History of colonic polyps 08/21/2010    Allergies: No Known Allergies Medications:  Current Outpatient Medications:  amoxicillin -clavulanate (AUGMENTIN ) 400-57 MG/5ML suspension, Take 10 mL by mouth twice daily for 7 days., Disp: 140 mL, Rfl: 0   promethazine-dextromethorphan (PROMETHAZINE-DM) 6.25-15 MG/5ML syrup, Take 5 mLs by mouth 4 (four) times daily as needed for cough., Disp: 118 mL, Rfl: 0    Cholecalciferol  50 MCG (2000 UT) TABS, Take 1 tablet (2,000 Units total) by mouth daily., Disp: 90 tablet, Rfl: 1   Probiotic Product (ALIGN) 4 MG CAPS, Take 1 capsule (4 mg total) by mouth daily., Disp: 30 capsule, Rfl: 1   psyllium (METAMUCIL) 58.6 % packet, Take 1 packet by mouth daily., Disp: , Rfl:   Observations/Objective: Patient is well-developed, well-nourished in no acute distress.  Resting comfortably at home.  Head is normocephalic, atraumatic.  No labored breathing. Speech is clear and coherent with logical content.  Patient is alert and oriented at baseline.   Assessment and Plan: 1. Acute bacterial sinusitis - promethazine-dextromethorphan (PROMETHAZINE-DM) 6.25-15 MG/5ML syrup; Take 5 mLs by mouth 4 (four) times daily as needed for cough.  Dispense: 118 mL; Refill: 0 - amoxicillin -clavulanate (AUGMENTIN ) 400-57 MG/5ML suspension; Take 10 mL by mouth twice daily for 7 days.  Dispense: 140 mL; Refill: 0  History of sinusitis.  Classic symptoms for him, ongoing greater than 10 days.  Now with low-grade fever.  Will start antibiotics with Augmentin .  Liquid suspension sent in per patient preference.  Rx Promethazine DM cough syrup as he has taken this before without issue.  Continue allergy medication regimen.  Follow-up in person for any nonresolving, new or worsening symptoms despite treatment.  Follow Up Instructions: I discussed the assessment and treatment plan with the patient. The patient was provided an opportunity to ask questions and all were answered. The patient agreed with the plan and demonstrated an understanding of the instructions.  A copy of instructions were sent to the patient via MyChart unless otherwise noted below.   The patient was advised to call back or seek an in-person evaluation if the symptoms worsen or if the condition fails to improve as anticipated.    Zachary Velma Lunger, PA-C

## 2024-05-21 NOTE — Patient Instructions (Signed)
 Fairy Koyanagi, thank you for joining Elsie Velma Lunger, PA-C for today's virtual visit.  While this provider is not your primary care provider (PCP), if your PCP is located in our provider database this encounter information will be shared with them immediately following your visit.   A [facility] MyChart account gives you access to today's visit and all your visits, tests, and labs performed at [facility]  click here if you don't have a [facility] MyChart account or go to [email or web address]  Consent: (Patient) Maceo Hernan provided verbal consent for this virtual visit at the beginning of the encounter.  Current Medications:  Current Outpatient Medications:    amoxicillin -clavulanate (AUGMENTIN ) 250-62.5 MG/5ML suspension, Take 10 mLs (500 mg total) by mouth 2 (two) times daily., Disp: 200 mL, Rfl: 0   Cholecalciferol  50 MCG (2000 UT) TABS, Take 1 tablet (2,000 Units total) by mouth daily., Disp: 90 tablet, Rfl: 1   Probiotic Product (ALIGN) 4 MG CAPS, Take 1 capsule (4 mg total) by mouth daily., Disp: 30 capsule, Rfl: 1   psyllium (METAMUCIL) 58.6 % packet, Take 1 packet by mouth daily., Disp: , Rfl:    Medications ordered in this encounter:  No orders of the defined types were placed in this encounter.    *If you need refills on other medications prior to your next appointment, please contact your pharmacy*  Follow-Up: Call back or seek an in-person evaluation if the symptoms worsen or if the condition fails to improve as anticipated.  [facility] [phone]  Other Instructions Please take antibiotic as directed.  Increase fluid intake.  Use Saline nasal spray.  Take a daily multivitamin.  Use the prescribed cough medicine as directed.  Please continue your allergy medication regimen.  Place a humidifier in the bedroom.  If you note any non-resolving, new, or worsening symptoms despite treatment, please seek an in-person evaluation  ASAP.   Sinusitis Sinusitis is redness, soreness, and swelling (inflammation) of the paranasal sinuses. Paranasal sinuses are air pockets within the bones of your face (beneath the eyes, the middle of the forehead, or above the eyes). In healthy paranasal sinuses, mucus is able to drain out, and air is able to circulate through them by way of your nose. However, when your paranasal sinuses are inflamed, mucus and air can become trapped. This can allow bacteria and other germs to grow and cause infection. Sinusitis can develop quickly and last only a short time (acute) or continue over a long period (chronic). Sinusitis that lasts for more than 12 weeks is considered chronic.  CAUSES  Causes of sinusitis include: Allergies. Structural abnormalities, such as displacement of the cartilage that separates your nostrils (deviated septum), which can decrease the air flow through your nose and sinuses and affect sinus drainage. Functional abnormalities, such as when the small hairs (cilia) that line your sinuses and help remove mucus do not work properly or are not present. SYMPTOMS  Symptoms of acute and chronic sinusitis are the same. The primary symptoms are pain and pressure around the affected sinuses. Other symptoms include: Upper toothache. Earache. Headache. Bad breath. Decreased sense of smell and taste. A cough, which worsens when you are lying flat. Fatigue. Fever. Thick drainage from your nose, which often is green and may contain pus (purulent). Swelling and warmth over the affected sinuses. DIAGNOSIS  Your caregiver will perform a physical exam. During the exam, your caregiver may: Look in your nose for signs of abnormal growths in your nostrils (nasal  polyps). Tap over the affected sinus to check for signs of infection. View the inside of your sinuses (endoscopy) with a special imaging device with a light attached (endoscope), which is inserted into your sinuses. If your caregiver  suspects that you have chronic sinusitis, one or more of the following tests may be recommended: Allergy tests. Nasal culture A sample of mucus is taken from your nose and sent to a lab and screened for bacteria. Nasal cytology A sample of mucus is taken from your nose and examined by your caregiver to determine if your sinusitis is related to an allergy. TREATMENT  Most cases of acute sinusitis are related to a viral infection and will resolve on their own within 10 days. Sometimes medicines are prescribed to help relieve symptoms (pain medicine, decongestants, nasal steroid sprays, or saline sprays).  However, for sinusitis related to a bacterial infection, your caregiver will prescribe antibiotic medicines. These are medicines that will help kill the bacteria causing the infection.  Rarely, sinusitis is caused by a fungal infection. In theses cases, your caregiver will prescribe antifungal medicine. For some cases of chronic sinusitis, surgery is needed. Generally, these are cases in which sinusitis recurs more than 3 times per year, despite other treatments. HOME CARE INSTRUCTIONS  Drink plenty of water. Water helps thin the mucus so your sinuses can drain more easily. Use a humidifier. Inhale steam 3 to 4 times a day (for example, sit in the bathroom with the shower running). Apply a warm, moist washcloth to your face 3 to 4 times a day, or as directed by your caregiver. Use saline nasal sprays to help moisten and clean your sinuses. Take over-the-counter or prescription medicines for pain, discomfort, or fever only as directed by your caregiver. SEEK IMMEDIATE MEDICAL CARE IF: You have increasing pain or severe headaches. You have nausea, vomiting, or drowsiness. You have swelling around your face. You have vision problems. You have a stiff neck. You have difficulty breathing. MAKE SURE YOU:  Understand these instructions. Will watch your condition. Will get help right away if you are  not doing well or get worse. Document Released: 11/05/2005 Document Revised: 01/28/2012 Document Reviewed: 11/20/2011 [facility] Patient Information 2014 [place], [place].    If you have been instructed to have an in-person evaluation today at a local Urgent Care facility, please use the link below. It will take you to a list of all of our available [facility], including address, phone number and hours of operation. Please do not delay care.  [facility]  If you or a family member do not have a primary care provider, use the link below to schedule a visit and establish care. When you choose a [facility] primary care physician or advanced practice provider, you gain a long-term partner in health. Find a Primary Care Provider  Learn more about [facility]'s in-office and virtual care options: [facility] - Get Care Now

## 2024-05-28 DIAGNOSIS — L812 Freckles: Secondary | ICD-10-CM | POA: Diagnosis not present

## 2024-05-28 DIAGNOSIS — D0439 Carcinoma in situ of skin of other parts of face: Secondary | ICD-10-CM | POA: Diagnosis not present

## 2024-05-28 DIAGNOSIS — D1801 Hemangioma of skin and subcutaneous tissue: Secondary | ICD-10-CM | POA: Diagnosis not present

## 2024-05-28 DIAGNOSIS — Z85828 Personal history of other malignant neoplasm of skin: Secondary | ICD-10-CM | POA: Diagnosis not present

## 2024-05-28 DIAGNOSIS — L570 Actinic keratosis: Secondary | ICD-10-CM | POA: Diagnosis not present

## 2024-05-28 DIAGNOSIS — D485 Neoplasm of uncertain behavior of skin: Secondary | ICD-10-CM | POA: Diagnosis not present

## 2024-05-28 DIAGNOSIS — L821 Other seborrheic keratosis: Secondary | ICD-10-CM | POA: Diagnosis not present

## 2024-06-08 ENCOUNTER — Encounter (HOSPITAL_COMMUNITY): Payer: Self-pay

## 2024-06-08 ENCOUNTER — Ambulatory Visit (HOSPITAL_COMMUNITY)
Admission: RE | Admit: 2024-06-08 | Discharge: 2024-06-08 | Disposition: A | Discharge status: Home / Self Care | Source: Ambulatory Visit | Attending: Internal Medicine | Admitting: Internal Medicine

## 2024-06-08 VITALS — BP 128/83 | HR 67 | Temp 98.2°F | Resp 16

## 2024-06-08 DIAGNOSIS — H66002 Acute suppurative otitis media without spontaneous rupture of ear drum, left ear: Secondary | ICD-10-CM | POA: Diagnosis not present

## 2024-06-08 MED ORDER — AMOXICILLIN 400 MG/5ML PO SUSR: 875.0000 mg | Freq: Two times a day (BID) | ORAL | 0 refills | Status: AC

## 2024-06-08 MED ORDER — CIPROFLOXACIN-DEXAMETHASONE 0.3-0.1 % OT SUSP: 4.0000 [drp] | Freq: Two times a day (BID) | OTIC | 0 refills | Status: AC

## 2024-06-08 NOTE — ED Provider Notes (Signed)
 [facility]    CSN: [id number] Arrival date & time: 06/08/24  1225      History   Chief Complaint Chief Complaint  Patient presents with   Otalgia    HPI Zachary Burke is a 73 y.o. male.   73 year old male who presents urgent care with complaints of left ear itching, irritation.  He reports this started about 3 days ago.  It has gotten worse.  He tried to put some cortisone cream into the ear but it has not helped.  He reports that when his ears get like this a lot of times he can just uses Flonase and it gets better but it has not improved.  He did just have a sinus infection and was treated with antibiotics for this.  He denies any fevers, chills, nausea, vomiting, sore throat or other constitutional symptoms.   Otalgia Associated symptoms: no abdominal pain, no cough, no fever, no rash, no sore throat and no vomiting     Past Medical History:  Diagnosis Date   Basal cell carcinoma    Colon polyps    2002,2006   Eczema    GERD (gastroesophageal reflux disease)    Hiatal hernia     Patient Active Problem List   Diagnosis Date Noted   Encounter for general adult medical examination with abnormal findings 11/04/2023   Plantar fasciitis 10/21/2023   Need for prophylactic vaccination and inoculation against varicella 11/02/2022   Peripheral sensory neuropathy 04/25/2022   Bradycardia 04/25/2022   Seasonal allergic rhinitis due to pollen 02/28/2022   BPH associated with nocturia 11/30/2021   Mild obstructive sleep apnea-hypopnea syndrome 11/30/2021   Chronic sinusitis 09/07/2021   Chronic bilateral low back pain without sciatica 01/31/2021   Vitamin D  deficiency disease 07/13/2020   Erectile dysfunction due to arterial insufficiency 07/12/2020   Hyperlipidemia LDL goal <130 07/12/2020   PUD (peptic ulcer disease) 07/12/2020   Esophageal reflux 08/21/2010   History of colonic polyps 08/21/2010    Past Surgical History:  Procedure Laterality Date    HERNIA REPAIR  2019   MOHS SURGERY  12/2022   nose and ear   NASAL SEPTUM SURGERY         Home Medications    Prior to Admission medications   Medication Sig Start Date End Date Taking? Authorizing Provider  amoxicillin  (AMOXIL ) 400 MG/5ML suspension Take 10.9 mLs (875 mg total) by mouth 2 (two) times daily for 10 days. 06/08/24 06/18/24 Yes Galilee Pierron A, PA-C  ciprofloxacin -dexamethasone  (CIPRODEX ) OTIC suspension Place 4 drops into the left ear 2 (two) times daily. 06/08/24  Yes Adenike Shidler A, PA-C  Cholecalciferol  50 MCG (2000 UT) TABS Take 1 tablet (2,000 Units total) by mouth daily. 07/13/20   Joshua Debby CROME, MD  Probiotic Product (ALIGN) 4 MG CAPS Take 1 capsule (4 mg total) by mouth daily. Patient taking differently: Take 1 capsule by mouth as needed. 09/07/21   Plotnikov, Aleksei V, MD  psyllium (METAMUCIL) 58.6 % packet Take 1 packet by mouth daily.    [provider]    Family History Family History  Problem Relation Age of Onset   COPD Mother    Colon cancer Paternal Aunt    Prostate cancer Paternal Uncle    Esophageal cancer Paternal Grandfather        passed away at 2   Rectal cancer Neg Hx    Stomach cancer Neg Hx     Social History Social History   Tobacco Use  Smoking status: Never    Passive exposure: Past   Smokeless tobacco: Never  Vaping Use   Vaping status: Never Used  Substance Use Topics   Alcohol use: Not Currently   Drug use: No     Allergies   Patient has no known allergies.   Review of Systems Review of Systems  Constitutional:  Negative for chills and fever.  HENT:  Positive for ear pain. Negative for sore throat.   Eyes:  Negative for pain and visual disturbance.  Respiratory:  Negative for cough and shortness of breath.   Cardiovascular:  Negative for chest pain and palpitations.  Gastrointestinal:  Negative for abdominal pain and vomiting.  Genitourinary:  Negative for dysuria and hematuria.   Musculoskeletal:  Negative for arthralgias and back pain.  Skin:  Negative for color change and rash.  Neurological:  Negative for seizures and syncope.  All other systems reviewed and are negative.    Physical Exam Triage Vital Signs ED Triage Vitals  Encounter Vitals Group     BP 06/08/24 1257 128/83     Girls Systolic BP Percentile --      Girls Diastolic BP Percentile --      Boys Systolic BP Percentile --      Boys Diastolic BP Percentile --      Pulse Rate 06/08/24 1257 67     Resp 06/08/24 1257 16     Temp 06/08/24 1257 98.2 F (36.8 C)     Temp Source 06/08/24 1257 Oral     SpO2 06/08/24 1257 94 %     Weight --      Height --      Head Circumference --      Peak Flow --      Pain Score 06/08/24 1258 1     Pain Loc --      Pain Education --      Exclude from Growth Chart --    No data found.  Updated Vital Signs BP 128/83 (BP Location: Left Arm)   Pulse 67   Temp 98.2 F (36.8 C) (Oral)   Resp 16   SpO2 94%   Visual Acuity Right Eye Distance:   Left Eye Distance:   Bilateral Distance:    Right Eye Near:   Left Eye Near:    Bilateral Near:     Physical Exam Vitals and nursing note reviewed.  Constitutional:      General: He is not in acute distress.    Appearance: He is well-developed.  HENT:     Head: Normocephalic and atraumatic.     Right Ear: Tympanic membrane and ear canal normal. There is no impacted cerumen.     Left Ear: Ear canal normal. A middle ear effusion is present. There is no impacted cerumen.     Ears:     Comments: Left tympanic membrane is mildly erythematous with fluid present.  The ear canal does not appear infected although there is some irritation present likely from Q-tips. Eyes:     Conjunctiva/sclera: Conjunctivae normal.  Cardiovascular:     Rate and Rhythm: Normal rate and regular rhythm.     Heart sounds: No murmur heard. Pulmonary:     Effort: Pulmonary effort is normal. No respiratory distress.     Breath  sounds: Normal breath sounds.  Abdominal:     Palpations: Abdomen is soft.     Tenderness: There is no abdominal tenderness.  Musculoskeletal:        General: No  swelling.     Cervical back: Neck supple.  Skin:    General: Skin is warm and dry.     Capillary Refill: Capillary refill takes less than 2 seconds.  Neurological:     Mental Status: He is alert.  Psychiatric:        Mood and Affect: Mood normal.      UC Treatments / Results  Labs (all labs ordered are listed, but only abnormal results are displayed) Labs Reviewed - No data to display  EKG   Radiology No results found.  Procedures Procedures (including critical care time)  Medications Ordered in UC Medications - No data to display  Initial Impression / Assessment and Plan / UC Course  I have reviewed the triage vital signs and the nursing notes.  Pertinent labs & imaging results that were available during my care of the patient were reviewed by me and considered in my medical decision making (see chart for details).     Non-recurrent acute suppurative otitis media of left ear without spontaneous rupture of tympanic membrane   Symptoms and physical exam findings are most consistent with an otitis media of the left ear. This is an ear infection. There is some irritation in the left ear canal but does not appear to be infected. We will treat this with a medicated drop that has a steroid to help with the itching. We will treat with the following:  Amoxicillin  10 mL twice daily for 10 days. This is an antibiotic. Take this with food.  Ciprodex  4 drops in the left ear twice daily. Return to urgent care or PCP if symptoms worsen or fail to resolve.    Final Clinical Impressions(s) / UC Diagnoses   Final diagnoses:  Non-recurrent acute suppurative otitis media of left ear without spontaneous rupture of tympanic membrane     Discharge Instructions      Symptoms and physical exam findings are most consistent  with an otitis media of the left ear. This is an ear infection. There is some irritation in the left ear canal but does not appear to be infected. We will treat this with a medicated drop that has a steroid to help with the itching. We will treat with the following:  Amoxicillin  10 mL twice daily for 10 days. This is an antibiotic. Take this with food.  Ciprodex  4 drops in the left ear twice daily. Return to urgent care or PCP if symptoms worsen or fail to resolve.      ED Prescriptions     Medication Sig Dispense Auth. Provider   ciprofloxacin -dexamethasone  (CIPRODEX ) OTIC suspension Place 4 drops into the left ear 2 (two) times daily. 7.5 mL Teresa Almarie LABOR, PA-C   amoxicillin  (AMOXIL ) 400 MG/5ML suspension Take 10.9 mLs (875 mg total) by mouth 2 (two) times daily for 10 days. 218 mL Teresa Almarie LABOR, [place]      PDMP not reviewed this encounter.   Teresa Almarie LABOR, PA-C 06/08/24 1449

## 2024-06-08 NOTE — ED Triage Notes (Signed)
 Patient here today with c/o left ear pain X 3 days. Patient states that he has put some cortisone cream in his ear which seemed to help.

## 2024-06-08 NOTE — Discharge Instructions (Addendum)
 Symptoms and physical exam findings are most consistent with an otitis media of the left ear. This is an ear infection. There is some irritation in the left ear canal but does not appear to be infected. We will treat this with a medicated drop that has a steroid to help with the itching. We will treat with the following:  Amoxicillin  10 mL twice daily for 10 days. This is an antibiotic. Take this with food.  Ciprodex  4 drops in the left ear twice daily. Return to urgent care or PCP if symptoms worsen or fail to resolve.

## 2024-10-13 DIAGNOSIS — L918 Other hypertrophic disorders of the skin: Secondary | ICD-10-CM | POA: Diagnosis not present

## 2024-10-13 DIAGNOSIS — Z85828 Personal history of other malignant neoplasm of skin: Secondary | ICD-10-CM | POA: Diagnosis not present

## 2024-10-13 DIAGNOSIS — D1801 Hemangioma of skin and subcutaneous tissue: Secondary | ICD-10-CM | POA: Diagnosis not present

## 2024-10-13 DIAGNOSIS — L821 Other seborrheic keratosis: Secondary | ICD-10-CM | POA: Diagnosis not present

## 2024-10-13 DIAGNOSIS — L812 Freckles: Secondary | ICD-10-CM | POA: Diagnosis not present

## 2024-10-13 DIAGNOSIS — L570 Actinic keratosis: Secondary | ICD-10-CM | POA: Diagnosis not present

## 2024-11-04 ENCOUNTER — Ambulatory Visit: Admitting: Internal Medicine

## 2024-11-04 ENCOUNTER — Encounter: Payer: Self-pay | Admitting: Internal Medicine

## 2024-11-04 VITALS — BP 128/80 | HR 68 | Temp 98.5°F | Ht 71.5 in | Wt 179.6 lb

## 2024-11-04 DIAGNOSIS — R351 Nocturia: Secondary | ICD-10-CM | POA: Diagnosis not present

## 2024-11-04 DIAGNOSIS — E559 Vitamin D deficiency, unspecified: Secondary | ICD-10-CM

## 2024-11-04 DIAGNOSIS — N5201 Erectile dysfunction due to arterial insufficiency: Secondary | ICD-10-CM | POA: Diagnosis not present

## 2024-11-04 DIAGNOSIS — Z23 Encounter for immunization: Secondary | ICD-10-CM | POA: Insufficient documentation

## 2024-11-04 DIAGNOSIS — Z136 Encounter for screening for cardiovascular disorders: Secondary | ICD-10-CM | POA: Diagnosis not present

## 2024-11-04 DIAGNOSIS — R001 Bradycardia, unspecified: Secondary | ICD-10-CM

## 2024-11-04 DIAGNOSIS — N401 Enlarged prostate with lower urinary tract symptoms: Secondary | ICD-10-CM

## 2024-11-04 DIAGNOSIS — Z0001 Encounter for general adult medical examination with abnormal findings: Secondary | ICD-10-CM

## 2024-11-04 DIAGNOSIS — G608 Other hereditary and idiopathic neuropathies: Secondary | ICD-10-CM | POA: Diagnosis not present

## 2024-11-04 DIAGNOSIS — E785 Hyperlipidemia, unspecified: Secondary | ICD-10-CM

## 2024-11-04 DIAGNOSIS — Z0000 Encounter for general adult medical examination without abnormal findings: Secondary | ICD-10-CM

## 2024-11-04 LAB — URINALYSIS, ROUTINE W REFLEX MICROSCOPIC
Bilirubin Urine: NEGATIVE
Hgb urine dipstick: NEGATIVE
Leukocytes,Ua: NEGATIVE
Nitrite: NEGATIVE
RBC / HPF: NONE SEEN (ref 0–?)
Specific Gravity, Urine: >=1.03 — AB (ref 1.000–1.030)
Total Protein, Urine: NEGATIVE
Urine Glucose: NEGATIVE
Urobilinogen, UA: 0.2 (ref 0.0–1.0)
pH: 6 (ref 5.0–8.0)

## 2024-11-04 LAB — LIPID PANEL
Cholesterol: 163 mg/dL (ref 28–200)
HDL: 39.7 mg/dL (ref >39.00)
LDL Cholesterol: 92 mg/dL (ref 10–99)
NonHDL: 123.46
Total CHOL/HDL Ratio: 4
Triglycerides: 157 mg/dL — ABNORMAL HIGH (ref 10.0–149.0)
VLDL: 31.4 mg/dL (ref 0.0–40.0)

## 2024-11-04 LAB — CBC WITH DIFFERENTIAL/PLATELET
Basophils Absolute: 0 K/uL (ref 0.0–0.1)
Basophils Relative: 0.6 % (ref 0.0–3.0)
Eosinophils Absolute: 0.2 K/uL (ref 0.0–0.7)
Eosinophils Relative: 3.3 % (ref 0.0–5.0)
HCT: 44.1 % (ref 39.0–52.0)
Hemoglobin: 14.7 g/dL (ref 13.0–17.0)
Lymphocytes Relative: 22.5 % (ref 12.0–46.0)
Lymphs Abs: 1.2 K/uL (ref 0.7–4.0)
MCHC: 33.4 g/dL (ref 30.0–36.0)
MCV: 89.3 fl (ref 78.0–100.0)
Monocytes Absolute: 0.6 K/uL (ref 0.1–1.0)
Monocytes Relative: 11.5 % (ref 3.0–12.0)
Neutro Abs: 3.2 K/uL (ref 1.4–7.7)
Neutrophils Relative %: 62.1 % (ref 43.0–77.0)
Platelets: 211 K/uL (ref 150.0–400.0)
RBC: 4.94 Mil/uL (ref 4.22–5.81)
RDW: 13.7 % (ref 11.5–15.5)
WBC: 5.2 K/uL (ref 4.0–10.5)

## 2024-11-04 LAB — HEPATIC FUNCTION PANEL
ALT: 13 U/L (ref 3–53)
AST: 17 U/L (ref 5–37)
Albumin: 4.5 g/dL (ref 3.5–5.2)
Alkaline Phosphatase: 58 U/L (ref 39–117)
Bilirubin, Direct: 0.1 mg/dL (ref 0.1–0.3)
Total Bilirubin: 0.4 mg/dL (ref 0.2–1.2)
Total Protein: 7.1 g/dL (ref 6.0–8.3)

## 2024-11-04 LAB — BASIC METABOLIC PANEL WITH GFR
BUN: 24 mg/dL — ABNORMAL HIGH (ref 6–23)
CO2: 31 meq/L (ref 19–32)
Calcium: 9.5 mg/dL (ref 8.4–10.5)
Chloride: 103 meq/L (ref 96–112)
Creatinine, Ser: 1.11 mg/dL (ref 0.40–1.50)
GFR: 66.16 mL/min (ref >60.00)
Glucose, Bld: 88 mg/dL (ref 70–99)
Potassium: 4.5 meq/L (ref 3.5–5.1)
Sodium: 139 meq/L (ref 135–145)

## 2024-11-04 LAB — PSA: PSA: 2.13 ng/mL (ref 0.10–4.00)

## 2024-11-04 LAB — VITAMIN D 25 HYDROXY (VIT D DEFICIENCY, FRACTURES): VITD: 18.54 ng/mL — ABNORMAL LOW (ref 30.00–100.00)

## 2024-11-04 LAB — TSH: TSH: 1.73 u[IU]/mL (ref 0.35–5.50)

## 2024-11-04 MED ORDER — CHOLECALCIFEROL 50 MCG (2000 UT) PO TABS: 1.0000 | ORAL_TABLET | Freq: Every day | ORAL | 1 refills | Status: AC

## 2024-11-04 MED ORDER — SHINGRIX 50 MCG/0.5ML IM SUSR: 0.5000 mL | Freq: Once | INTRAMUSCULAR | 1 refills | Status: AC

## 2024-11-04 NOTE — Patient Instructions (Signed)
 Health Maintenance, Male  Adopting a healthy lifestyle and getting preventive care are important in promoting health and wellness. Ask your health care provider about:  The right schedule for you to have regular tests and exams.  Things you can do on your own to prevent diseases and keep yourself healthy.  What should I know about diet, weight, and exercise?  Eat a healthy diet    Eat a diet that includes plenty of vegetables, fruits, low-fat dairy products, and lean protein.  Do not eat a lot of foods that are high in solid fats, added sugars, or sodium.  Maintain a healthy weight  Body mass index (BMI) is a measurement that can be used to identify possible weight problems. It estimates body fat based on height and weight. Your health care provider can help determine your BMI and help you achieve or maintain a healthy weight.  Get regular exercise  Get regular exercise. This is one of the most important things you can do for your health. Most adults should:  Exercise for at least 150 minutes each week. The exercise should increase your heart rate and make you sweat (moderate-intensity exercise).  Do strengthening exercises at least twice a week. This is in addition to the moderate-intensity exercise.  Spend less time sitting. Even light physical activity can be beneficial.  Watch cholesterol and blood lipids  Have your blood tested for lipids and cholesterol at 72 years of age, then have this test every 5 years.  You may need to have your cholesterol levels checked more often if:  Your lipid or cholesterol levels are high.  You are older than 72 years of age.  You are at high risk for heart disease.  What should I know about cancer screening?  Many types of cancers can be detected early and may often be prevented. Depending on your health history and family history, you may need to have cancer screening at various ages. This may include screening for:  Colorectal cancer.  Prostate cancer.  Skin cancer.  Lung  cancer.  What should I know about heart disease, diabetes, and high blood pressure?  Blood pressure and heart disease  High blood pressure causes heart disease and increases the risk of stroke. This is more likely to develop in people who have high blood pressure readings or are overweight.  Talk with your health care provider about your target blood pressure readings.  Have your blood pressure checked:  Every 3-5 years if you are 24-52 years of age.  Every year if you are 3 years old or older.  If you are between the ages of 60 and 72 and are a current or former smoker, ask your health care provider if you should have a one-time screening for abdominal aortic aneurysm (AAA).  Diabetes  Have regular diabetes screenings. This checks your fasting blood sugar level. Have the screening done:  Once every three years after age 66 if you are at a normal weight and have a low risk for diabetes.  More often and at a younger age if you are overweight or have a high risk for diabetes.  What should I know about preventing infection?  Hepatitis B  If you have a higher risk for hepatitis B, you should be screened for this virus. Talk with your health care provider to find out if you are at risk for hepatitis B infection.  Hepatitis C  Blood testing is recommended for:  Everyone born from [birth year] through 1964.  Anyone  with known risk factors for hepatitis C.  Sexually transmitted infections (STIs)  You should be screened each year for STIs, including gonorrhea and chlamydia, if:  You are sexually active and are younger than 72 years of age.  You are older than 72 years of age and your health care provider tells you that you are at risk for this type of infection.  Your sexual activity has changed since you were last screened, and you are at increased risk for chlamydia or gonorrhea. Ask your health care provider if you are at risk.  Ask your health care provider about whether you are at high risk for HIV. Your health care provider  may recommend a prescription medicine to help prevent HIV infection. If you choose to take medicine to prevent HIV, you should first get tested for HIV. You should then be tested every 3 months for as long as you are taking the medicine.  Follow these instructions at home:  Alcohol use  Do not drink alcohol if your health care provider tells you not to drink.  If you drink alcohol:  Limit how much you have to 0-2 drinks a day.  Know how much alcohol is in your drink. In the [place] one drink equals one 12 oz bottle of beer (355 mL), one 5 oz glass of wine (148 mL), or one 1 oz glass of hard liquor (44 mL).  Lifestyle  Do not use any products that contain nicotine or tobacco. These products include cigarettes, chewing tobacco, and vaping devices, such as e-cigarettes. If you need help quitting, ask your health care provider.  Do not use street drugs.  Do not share needles.  Ask your health care provider for help if you need support or information about quitting drugs.  General instructions  Schedule regular health, dental, and eye exams.  Stay current with your vaccines.  Tell your health care provider if:  You often feel depressed.  You have ever been abused or do not feel safe at home.  Summary  Adopting a healthy lifestyle and getting preventive care are important in promoting health and wellness.  Follow your health care provider's instructions about healthy diet, exercising, and getting tested or screened for diseases.  Follow your health care provider's instructions on monitoring your cholesterol and blood pressure.  This information is not intended to replace advice given to you by your health care provider. Make sure you discuss any questions you have with your health care provider.  Document Revised: 03/27/2021 Document Reviewed: 03/27/2021  Elsevier Patient Education  2024 [employer].

## 2024-11-04 NOTE — Progress Notes (Unsigned)
 Subjective:  Patient ID: Zachary Burke, male    DOB: Sep 19, 1951  Age: 73 y.o. MRN: [id number]  CC: Annual Exam and Hyperlipidemia   HPI Zachary Burke presents for a CPX and f/up ---  Discussed the use of AI scribe software for clinical note transcription with the patient, who gave verbal consent to proceed.  History of Present Illness Zachary Burke is a 73 year old male who presents with heat intolerance and concerns about electrolyte balance.  He experiences significant heat intolerance, particularly when temperatures exceed 80 degrees Fahrenheit. Symptoms include muscle weakness and a need to sit down, resembling 'flu symptoms'. These episodes have been occurring for several months, with the last one happening a couple of months ago during a family reunion. He manages these episodes by consuming coconut water, bananas, and Tylenol, which he finds helpful in reducing recovery time from three days to about one and a half to two days. He avoids Gatorade and finds that coconut water is 'fantastic'. Despite drinking water, he feels that his electrolytes are depleted quickly in hot conditions. He has not experienced syncope but recognizes the onset of symptoms and takes measures to cool down.  He also mentions a decline in his sex life, noting difficulty in achieving erections. He is curious about potential treatments and whether his testosterone  levels might be a factor. He has been married for 49 years and values his relationship with his wife, emphasizing his enjoyment of each other's company.  He enjoys outdoor activities and is active in cold weather, spending time chopping wood. No chest pain, shortness of breath, or cold sweats during physical activity such as chopping wood.     Outpatient Medications Prior to Visit  Medication Sig Dispense Refill   Probiotic Product (ALIGN) 4 MG CAPS Take 1 capsule (4 mg total) by mouth daily. (Patient taking differently: Take 1 capsule by mouth  as needed.) 30 capsule 1   psyllium (METAMUCIL) 58.6 % packet Take 1 packet by mouth daily.     Cholecalciferol  50 MCG (2000 UT) TABS Take 1 tablet (2,000 Units total) by mouth daily. 90 tablet 1   ciprofloxacin -dexamethasone  (CIPRODEX ) OTIC suspension Place 4 drops into the left ear 2 (two) times daily. (Patient not taking: Reported on 11/04/2024) 7.5 mL 0   No facility-administered medications prior to visit.    ROS Review of Systems  Genitourinary:  Positive for difficulty urinating.  Hematological:  Negative for adenopathy. Does not bruise/bleed easily.    Objective:  BP 128/80 (BP Location: Right Arm, Patient Position: Sitting)   Pulse 68   Temp 98.5 F (36.9 C) (Temporal)   Ht 5' 11.5 (1.816 m)   Wt 179 lb 9.6 oz (81.5 kg)   SpO2 95%   BMI 24.70 kg/m   BP Readings from Last 3 Encounters:  11/04/24 128/80  06/08/24 128/83  11/04/23 126/74    Wt Readings from Last 3 Encounters:  11/04/24 179 lb 9.6 oz (81.5 kg)  11/04/23 180 lb 3.2 oz (81.7 kg)  08/01/23 176 lb 6.4 oz (80 kg)    Physical Exam Cardiovascular:     Rate and Rhythm: Regular rhythm. Bradycardia present.     Comments: EKG- SB with SA, 58 bpm Anterior infarct pattern is not new No LVH Unchanged  Abdominal:     Hernia: There is no hernia in the left inguinal area or right inguinal area.  Genitourinary:    Pubic Area: No rash.      Penis: Normal and circumcised.  Testes: Normal.     Epididymis:     Right: Normal.     Left: Normal.     Prostate: Enlarged. Not tender and no nodules present.     Rectum: Guaiac result negative. External hemorrhoid present. No mass, tenderness, anal fissure or internal hemorrhoid. Normal anal tone.  Musculoskeletal:     Right lower leg: No edema.     Left lower leg: No edema.  Lymphadenopathy:     Lower Body: No right inguinal adenopathy. No left inguinal adenopathy.     Lab Results  Component Value Date   WBC 5.2 11/04/2024   HGB 14.7 11/04/2024   HCT  44.1 11/04/2024   PLT 211.0 11/04/2024   GLUCOSE 88 11/04/2024   CHOL 163 11/04/2024   TRIG 157.0 (H) 11/04/2024   HDL 39.70 11/04/2024   LDLCALC 92 11/04/2024   ALT 13 11/04/2024   AST 17 11/04/2024   NA 139 11/04/2024   K 4.5 11/04/2024   CL 103 11/04/2024   CREATININE 1.11 11/04/2024   BUN 24 (H) 11/04/2024   CO2 31 11/04/2024   TSH 1.73 11/04/2024   PSA 2.13 11/04/2024   HGBA1C 5.5 07/12/2020    No results found.  Assessment & Plan:  Erectile dysfunction due to arterial insufficiency -     Prolactin; Future -     Testosterone  Total,Free,Bio, Males; Future -     TSH; Future -     Basic metabolic panel with GFR; Future  Hyperlipidemia LDL goal <130 -     Lipid panel; Future -     Hepatic function panel; Future  Vitamin D  deficiency disease -     Basic metabolic panel with GFR; Future -     VITAMIN D  25 Hydroxy (Vit-D Deficiency, Fractures); Future -     Consult to Registered Dietitian; Future -     Cholecalciferol ; Take 1 tablet (2,000 Units total) by mouth daily.  Dispense: 90 tablet; Refill: 1  BPH associated with nocturia -     Urinalysis, Routine w reflex microscopic; Future -     Basic metabolic panel with GFR; Future -     PSA; Future  Bradycardia -     TSH; Future -     Basic metabolic panel with GFR; Future  Peripheral sensory neuropathy -     CBC with Differential/Platelet; Future -     Basic metabolic panel with GFR; Future  Need for prophylactic vaccination and inoculation against varicella -     Shingrix ; Inject 0.5 mLs into the muscle once for 1 dose.  Dispense: 0.5 mL; Refill: 1  Encounter for screening for coronary artery disease -     EKG 12-Lead  Immunization due -     Pneumococcal polysaccharide vaccine 23-valent greater than or equal to 2yo subcutaneous/IM     Follow-up: Return in about 6 months (around 05/05/2025).  Debby Molt, MD

## 2024-11-05 ENCOUNTER — Ambulatory Visit: Payer: Self-pay | Admitting: Internal Medicine

## 2024-11-05 LAB — TESTOSTERONE TOTAL,FREE,BIO, MALES
Albumin: 4.6 g/dL (ref 3.6–5.1)
Sex Hormone Binding: 55 nmol/L (ref 22–77)
Testosterone, Bioavailable: 90.5 ng/dL (ref 15.0–150.0)
Testosterone, Free: 43.1 pg/mL (ref 6.0–73.0)
Testosterone: 503 ng/dL (ref 250–827)

## 2024-11-05 LAB — PROLACTIN: Prolactin: 7.6 ng/mL (ref 2.0–18.0)

## 2024-11-05 MED ORDER — TADALAFIL 5 MG PO TABS: 5.0000 mg | ORAL_TABLET | Freq: Every day | ORAL | 1 refills | Status: AC

## 2024-11-05 NOTE — Assessment & Plan Note (Signed)
Exam completed, labs reviewed, vaccines reviewed and updated, cancer screenings addressed, pt ed material was given.
